# Patient Record
Sex: Female | Born: 1970 | Race: Asian | Hispanic: No | Smoking: Former smoker
Health system: Southern US, Community
[De-identification: ages and names within clinical notes are randomized; demographics above are authoritative.]

## PROBLEM LIST (undated history)

## (undated) ENCOUNTER — Emergency Department (HOSPITAL_COMMUNITY): Admission: EM | Discharge status: Against Medical Advice | Payer: No Typology Code available for payment source

## (undated) DIAGNOSIS — E119 Type 2 diabetes mellitus without complications: Secondary | ICD-10-CM

## (undated) DIAGNOSIS — M1990 Unspecified osteoarthritis, unspecified site: Secondary | ICD-10-CM

## (undated) DIAGNOSIS — O24419 Gestational diabetes mellitus in pregnancy, unspecified control: Secondary | ICD-10-CM

## (undated) DIAGNOSIS — N12 Tubulo-interstitial nephritis, not specified as acute or chronic: Secondary | ICD-10-CM

## (undated) DIAGNOSIS — N200 Calculus of kidney: Secondary | ICD-10-CM

## (undated) DIAGNOSIS — G8929 Other chronic pain: Secondary | ICD-10-CM

## (undated) DIAGNOSIS — O139 Gestational [pregnancy-induced] hypertension without significant proteinuria, unspecified trimester: Secondary | ICD-10-CM

## (undated) DIAGNOSIS — I10 Essential (primary) hypertension: Secondary | ICD-10-CM

## (undated) DIAGNOSIS — E876 Hypokalemia: Secondary | ICD-10-CM

## (undated) DIAGNOSIS — H269 Unspecified cataract: Secondary | ICD-10-CM

## (undated) DIAGNOSIS — E785 Hyperlipidemia, unspecified: Secondary | ICD-10-CM

## (undated) DIAGNOSIS — M549 Dorsalgia, unspecified: Secondary | ICD-10-CM

## (undated) DIAGNOSIS — F419 Anxiety disorder, unspecified: Secondary | ICD-10-CM

## (undated) HISTORY — DX: Calculus of kidney: N20.0

## (undated) HISTORY — DX: Unspecified cataract: H26.9

## (undated) HISTORY — PX: HERNIA REPAIR: SHX51

## (undated) HISTORY — PX: APPENDECTOMY: SHX54

## (undated) HISTORY — DX: Tubulo-interstitial nephritis, not specified as acute or chronic: N12

## (undated) HISTORY — DX: Gestational (pregnancy-induced) hypertension without significant proteinuria, unspecified trimester: O13.9

## (undated) HISTORY — DX: Hyperlipidemia, unspecified: E78.5

## (undated) HISTORY — DX: Anxiety disorder, unspecified: F41.9

## (undated) HISTORY — DX: Hypokalemia: E87.6

## (undated) HISTORY — DX: Gestational diabetes mellitus in pregnancy, unspecified control: O24.419

---

## 1998-01-18 ENCOUNTER — Ambulatory Visit (HOSPITAL_COMMUNITY): Admission: RE | Admit: 1998-01-18 | Discharge: 1998-01-18 | Payer: Self-pay | Admitting: Neurosurgery

## 1998-01-18 ENCOUNTER — Encounter: Payer: Self-pay | Admitting: Neurosurgery

## 1998-09-12 ENCOUNTER — Other Ambulatory Visit: Admission: RE | Admit: 1998-09-12 | Discharge: 1998-09-12 | Payer: Self-pay | Admitting: Obstetrics and Gynecology

## 1999-02-22 ENCOUNTER — Ambulatory Visit (HOSPITAL_COMMUNITY): Admission: RE | Admit: 1999-02-22 | Discharge: 1999-02-22 | Payer: Self-pay | Admitting: Family Medicine

## 1999-02-22 ENCOUNTER — Encounter: Payer: Self-pay | Admitting: Family Medicine

## 1999-05-20 ENCOUNTER — Ambulatory Visit (HOSPITAL_COMMUNITY): Admission: RE | Admit: 1999-05-20 | Discharge: 1999-05-20 | Payer: Self-pay | Admitting: Obstetrics and Gynecology

## 1999-05-20 ENCOUNTER — Encounter: Payer: Self-pay | Admitting: Obstetrics and Gynecology

## 1999-11-03 ENCOUNTER — Inpatient Hospital Stay (HOSPITAL_COMMUNITY): Admission: AD | Admit: 1999-11-03 | Discharge: 1999-11-05 | Payer: Self-pay | Admitting: Obstetrics

## 2001-07-20 ENCOUNTER — Ambulatory Visit (HOSPITAL_COMMUNITY): Admission: RE | Admit: 2001-07-20 | Discharge: 2001-07-20 | Payer: Self-pay | Admitting: Family Medicine

## 2001-07-20 ENCOUNTER — Encounter: Payer: Self-pay | Admitting: Family Medicine

## 2005-10-29 ENCOUNTER — Emergency Department (HOSPITAL_COMMUNITY): Admission: EM | Admit: 2005-10-29 | Discharge: 2005-10-29 | Payer: Self-pay | Admitting: Emergency Medicine

## 2007-07-28 ENCOUNTER — Inpatient Hospital Stay (HOSPITAL_COMMUNITY): Admission: EM | Admit: 2007-07-28 | Discharge: 2007-07-29 | Payer: Self-pay | Admitting: Obstetrics and Gynecology

## 2007-07-28 ENCOUNTER — Encounter (INDEPENDENT_AMBULATORY_CARE_PROVIDER_SITE_OTHER): Payer: Self-pay | Admitting: Obstetrics and Gynecology

## 2007-08-09 ENCOUNTER — Emergency Department (HOSPITAL_COMMUNITY): Admission: EM | Admit: 2007-08-09 | Discharge: 2007-08-09 | Payer: Self-pay | Admitting: Emergency Medicine

## 2007-08-22 ENCOUNTER — Encounter: Admission: RE | Admit: 2007-08-22 | Discharge: 2007-08-22 | Payer: Self-pay | Admitting: Family Medicine

## 2007-09-27 ENCOUNTER — Encounter: Admission: RE | Admit: 2007-09-27 | Discharge: 2007-09-27 | Payer: Self-pay | Admitting: Neurology

## 2008-01-27 ENCOUNTER — Encounter: Admission: RE | Admit: 2008-01-27 | Discharge: 2008-03-17 | Payer: Self-pay | Admitting: Family Medicine

## 2008-06-17 ENCOUNTER — Emergency Department (HOSPITAL_COMMUNITY): Admission: EM | Admit: 2008-06-17 | Discharge: 2008-06-17 | Payer: Self-pay | Admitting: Internal Medicine

## 2009-12-31 ENCOUNTER — Emergency Department (HOSPITAL_COMMUNITY)
Admission: EM | Admit: 2009-12-31 | Discharge: 2009-12-31 | Payer: Self-pay | Source: Home / Self Care | Admitting: Emergency Medicine

## 2010-01-24 ENCOUNTER — Encounter
Admission: RE | Admit: 2010-01-24 | Discharge: 2010-02-12 | Payer: Self-pay | Source: Home / Self Care | Attending: Family Medicine | Admitting: Family Medicine

## 2010-02-02 ENCOUNTER — Encounter: Payer: Self-pay | Admitting: Internal Medicine

## 2010-02-03 ENCOUNTER — Encounter: Payer: Self-pay | Admitting: Family Medicine

## 2010-02-19 ENCOUNTER — Ambulatory Visit: Payer: Medicaid Other | Attending: Family Medicine

## 2010-02-19 ENCOUNTER — Ambulatory Visit: Payer: Medicaid Other

## 2010-02-19 DIAGNOSIS — IMO0001 Reserved for inherently not codable concepts without codable children: Secondary | ICD-10-CM | POA: Insufficient documentation

## 2010-02-19 DIAGNOSIS — M542 Cervicalgia: Secondary | ICD-10-CM | POA: Insufficient documentation

## 2010-02-19 DIAGNOSIS — R209 Unspecified disturbances of skin sensation: Secondary | ICD-10-CM | POA: Insufficient documentation

## 2010-02-19 DIAGNOSIS — R5381 Other malaise: Secondary | ICD-10-CM | POA: Insufficient documentation

## 2010-02-25 ENCOUNTER — Ambulatory Visit: Payer: Medicaid Other | Admitting: Physical Therapy

## 2010-02-27 ENCOUNTER — Ambulatory Visit: Payer: Medicaid Other | Admitting: Physical Therapy

## 2010-03-17 ENCOUNTER — Emergency Department (HOSPITAL_COMMUNITY)
Admission: EM | Admit: 2010-03-17 | Discharge: 2010-03-17 | Disposition: A | Discharge status: Home / Self Care | Payer: Medicaid Other | Attending: Emergency Medicine | Admitting: Emergency Medicine

## 2010-03-17 DIAGNOSIS — IMO0002 Reserved for concepts with insufficient information to code with codable children: Secondary | ICD-10-CM | POA: Insufficient documentation

## 2010-03-17 DIAGNOSIS — F411 Generalized anxiety disorder: Secondary | ICD-10-CM | POA: Insufficient documentation

## 2010-03-17 DIAGNOSIS — R10819 Abdominal tenderness, unspecified site: Secondary | ICD-10-CM | POA: Insufficient documentation

## 2010-03-17 DIAGNOSIS — I10 Essential (primary) hypertension: Secondary | ICD-10-CM | POA: Insufficient documentation

## 2010-03-17 DIAGNOSIS — E876 Hypokalemia: Secondary | ICD-10-CM | POA: Insufficient documentation

## 2010-03-17 LAB — ETHANOL: Alcohol, Ethyl (B): <5 mg/dL (ref 0–10)

## 2010-03-17 LAB — CBC
MCHC: 31.8 g/dL (ref 30.0–36.0)
RDW: 13.6 % (ref 11.5–15.5)

## 2010-03-17 LAB — URINALYSIS, ROUTINE W REFLEX MICROSCOPIC
Hgb urine dipstick: NEGATIVE
Nitrite: NEGATIVE
Specific Gravity, Urine: 1.005 (ref 1.005–1.030)
Urobilinogen, UA: 0.2 mg/dL (ref 0.0–1.0)

## 2010-03-17 LAB — COMPREHENSIVE METABOLIC PANEL
ALT: 17 U/L (ref 0–35)
CO2: 26 mEq/L (ref 19–32)
Calcium: 9.8 mg/dL (ref 8.4–10.5)
Creatinine, Ser: 0.74 mg/dL (ref 0.4–1.2)
GFR calc non Af Amer: >60 mL/min (ref >60)
Glucose, Bld: 112 mg/dL — ABNORMAL HIGH (ref 70–99)

## 2010-03-17 LAB — POCT CARDIAC MARKERS: Myoglobin, poc: 69.4 ng/mL (ref 12–200)

## 2010-03-17 LAB — DIFFERENTIAL
Basophils Absolute: 0 10*3/uL (ref 0.0–0.1)
Basophils Relative: 0 % (ref 0–1)
Eosinophils Relative: 0 % (ref 0–5)
Monocytes Absolute: 0.3 10*3/uL (ref 0.1–1.0)

## 2010-03-17 LAB — WET PREP, GENITAL
Clue Cells Wet Prep HPF POC: NONE SEEN
Trich, Wet Prep: NONE SEEN

## 2010-03-17 LAB — URINE MICROSCOPIC-ADD ON

## 2010-03-17 LAB — RAPID URINE DRUG SCREEN, HOSP PERFORMED
Amphetamines: NOT DETECTED
Barbiturates: NOT DETECTED
Tetrahydrocannabinol: NOT DETECTED

## 2010-03-18 LAB — GC/CHLAMYDIA PROBE AMP, GENITAL: Chlamydia, DNA Probe: NEGATIVE

## 2010-03-25 LAB — POCT CARDIAC MARKERS
CKMB, poc: <1 ng/mL — ABNORMAL LOW (ref 1.0–8.0)
Myoglobin, poc: 49.3 ng/mL (ref 12–200)
Troponin i, poc: <0.05 ng/mL (ref 0.00–0.09)

## 2010-03-25 LAB — POCT I-STAT, CHEM 8
BUN: 8 mg/dL (ref 6–23)
Calcium, Ion: 1.1 mmol/L — ABNORMAL LOW (ref 1.12–1.32)
Chloride: 104 mEq/L (ref 96–112)
Potassium: 3.5 mEq/L (ref 3.5–5.1)

## 2010-04-17 ENCOUNTER — Encounter: Payer: Medicaid Other | Admitting: Obstetrics and Gynecology

## 2010-04-19 ENCOUNTER — Other Ambulatory Visit: Payer: Self-pay | Admitting: Specialist

## 2010-04-19 DIAGNOSIS — R102 Pelvic and perineal pain: Secondary | ICD-10-CM

## 2010-04-22 ENCOUNTER — Encounter: Payer: Medicaid Other | Admitting: Family Medicine

## 2010-04-22 LAB — URINALYSIS, ROUTINE W REFLEX MICROSCOPIC
Ketones, ur: NEGATIVE mg/dL
Nitrite: NEGATIVE
Protein, ur: NEGATIVE mg/dL
Urobilinogen, UA: 0.2 mg/dL (ref 0.0–1.0)
pH: 6.5 (ref 5.0–8.0)

## 2010-04-22 LAB — CBC
HCT: 40.8 % (ref 36.0–46.0)
Hemoglobin: 13.7 g/dL (ref 12.0–15.0)
MCHC: 33.5 g/dL (ref 30.0–36.0)
MCV: 85.1 fL (ref 78.0–100.0)
RBC: 4.8 MIL/uL (ref 3.87–5.11)
RDW: 13.8 % (ref 11.5–15.5)

## 2010-04-22 LAB — DIFFERENTIAL
Basophils Absolute: 0 10*3/uL (ref 0.0–0.1)
Basophils Relative: 0 % (ref 0–1)
Eosinophils Absolute: 0.1 10*3/uL (ref 0.0–0.7)
Eosinophils Relative: 1 % (ref 0–5)
Lymphocytes Relative: 26 % (ref 12–46)
Lymphs Abs: 2.1 10*3/uL (ref 0.7–4.0)
Monocytes Absolute: 0.4 10*3/uL (ref 0.1–1.0)
Monocytes Relative: 5 % (ref 3–12)
Neutro Abs: 5.3 10*3/uL (ref 1.7–7.7)
Neutrophils Relative %: 67 % (ref 43–77)

## 2010-04-22 LAB — COMPREHENSIVE METABOLIC PANEL
BUN: 10 mg/dL (ref 6–23)
CO2: 27 mEq/L (ref 19–32)
Calcium: 9.3 mg/dL (ref 8.4–10.5)
GFR calc non Af Amer: >60 mL/min (ref >60)
Glucose, Bld: 110 mg/dL — ABNORMAL HIGH (ref 70–99)
Total Protein: 6.7 g/dL (ref 6.0–8.3)

## 2010-04-22 LAB — GC/CHLAMYDIA PROBE AMP, GENITAL: GC Probe Amp, Genital: NEGATIVE

## 2010-04-22 LAB — POCT PREGNANCY, URINE: Preg Test, Ur: NEGATIVE

## 2010-04-22 LAB — WET PREP, GENITAL: Yeast Wet Prep HPF POC: NONE SEEN

## 2010-04-22 LAB — LIPASE, BLOOD: Lipase: 19 U/L (ref 11–59)

## 2010-04-23 ENCOUNTER — Ambulatory Visit
Admission: RE | Admit: 2010-04-23 | Discharge: 2010-04-23 | Disposition: A | Discharge status: Home / Self Care | Payer: Medicaid Other | Source: Ambulatory Visit | Attending: Specialist | Admitting: Specialist

## 2010-04-23 DIAGNOSIS — R102 Pelvic and perineal pain: Secondary | ICD-10-CM

## 2010-05-10 ENCOUNTER — Encounter: Payer: Medicaid Other | Admitting: Obstetrics and Gynecology

## 2010-05-28 NOTE — Op Note (Signed)
NAMEASTER, SCREWS                ACCOUNT NO.:  [id number]   MEDICAL RECORD NO.:  [id number]          PATIENT TYPE:  INP   LOCATION:  9307                          FACILITY:  [facility]   PHYSICIAN:  Charles A. Delcambre, MDDATE OF BIRTH:  [birth year]   DATE OF PROCEDURE:  07/28/2007  DATE OF DISCHARGE:                               OPERATIVE REPORT   PREOPERATIVE DIAGNOSES:  1. Bartholin abscess.  2. Cellulitis, left perineum   POSTOPERATIVE DIAGNOSES:  1. Bartholin abscess.  2. Cellulitis, left perineum.   PROCEDURE:  Marsupialization and packing of the Bartholin abscess.   SURGEON:  Charles A. Delcambre, MD   ASSISTANT:  None.   COMPLICATIONS:  None.   ESTIMATED BLOOD LOSS:  Less than 50 mL.   ANESTHESIA:  Spinal.   OPERATIVE FINDINGS:  Very large abscess extending up the entire left  labia as far as the pubic ramus and indurated laterally in that area as  well an area of approximately 3 x 6 cm superiorly up to the pubic rami.   SPECIMEN:  Portion of the cyst or abscess wall to pathology.  Aerobic  and anaerobic cultures were done on the purulent drainage from inside  the incision.  These were all sent to pathology.   COUNTS:  Instrument, sponge, and needle count correct x2.   DESCRIPTION OF PROCEDURE:  The patient was taken to the operating room,  placed in supine position after spinal had been given.  She was then  placed in dorsal lithotomy position in Universal stirrups.  Palpating  carefully our findings were noted as above.  There was no evidence of  pointing area of the abscess.  Therefore, incision was made  approximately 3 cm in the area of the intravaginal Bartholin drainage  area.  A wedge of tissue was excised, so that the opening would be a  round opening and meant to be permanent.  A large amount of purulent  material drained.  Irrigation with 500-600 mL of saline was done to  irrigate out the area after cultures had been taken.  Finger dissection  up to  the pubic ramus was done to break up all loculations.  Bleeding  was minimal.  Running locking 2-0 chromic was used to close the edge in  a circular fashion to remain open at the vaginal incision site.  Marcaine 0.25% with epinephrine had been injected at the incision site  prior to incision with total of 7 mL injected.  Half-inch plain  gauze was then packed up to the upper extent and then backed down to the  incision site and tucked in the vagina.  Hemostasis was excellent.  The  patient tolerated the procedure well.  Straight cath was done of the  bladder.  She was then taken to recovery with physician in attendance  having tolerated the procedure well.      Charles A. Sydnee Cabal, MD  Electronically Signed     CAD/MEDQ  D:  07/28/2007  T:  07/29/2007  Job:  [id number]

## 2010-05-28 NOTE — H&P (Signed)
Renee Mccann, Renee Mccann               ACCOUNT NO.:  [id number]   MEDICAL RECORD NO.:  [id number]          PATIENT TYPE:   LOCATION:                                 FACILITY:   PHYSICIAN:  Charles A. Delcambre, MDDATE OF BIRTH:  11/02/1954   DATE OF ADMISSION:  DATE OF DISCHARGE:                              HISTORY & PHYSICAL   She tends to state that she is 40 years old, but clearly indicates her  birthday is in [birth year], putting her at a 40 year old gravida 2, para 2  complaining of 3 months with a swelling on her labia.  She has seen 2  other OB/GYN offices.  At each office, the cyst was drained in the  office or aspirated and further description and showing her what needle,  she states it was an aspiration most likely.  Records are not available.  She states this cyst has been present for 3 months.  She denies fever,  but she does have increasing pain.  She was seen by Dr. Renaldo Fiddler twice and  then Dr. Windle Guard 5 months ago, all for the same problem.  She sees  Dr. Jeannetta Nap as her PCP.  She was referred from Dr. Cliffton Asters at [facility].   PAST MEDICAL HISTORY:  None.   PAST SURGICAL HISTORY:  None.   MEDICATIONS:  She is given a prescription of Vicodin today; otherwise,  no medications.   ALLERGIES:  None.   SOCIAL HISTORY:  Five cigarettes per day.  She is sexually active with 1  partner who accompanies her today.  No alcohol or drugs.   FAMILY HISTORY:  No major illnesses.   REVIEW OF SYSTEMS:  Full review of system negative.  Pain with intercourse and bulge on her perineum.  No dysuria.  Otherwise  put negative template.Marland Kitchen   PHYSICAL EXAMINATION:  VITAL SIGNS:  Blood pressure 122/80, weight 152  pounds, and height 5 feet 3-1/2 inches.  HEART:  Regular rate and rhythm.  LUNGS:  Clear bilaterally.  ABDOMEN:  Soft, flat, and nontender.  PELVIC:  External female genitalia noted with marked swelling on the  left labia, feels to be likely a 6 x 2 chronic nearly fluctuant but  not  quite abscess inferior aspect of the labia majora introital area.  There  is induration extending superiorly up to the top of the pubic ramus  inferiorly area.  I can detect some adenopathy at this time in the  inguinal area.  The area was tender and erythemic toward midline, but  otherwise just a swelling and induration.  There is no obvious area of  pointing.   ASSESSMENT:  1. Bartholin abscess, 616.2.  2. Vulvar cyst and edema, 624.8.  3. Vulvar cellulitis 616.10.   PLAN:  Incision and drainage and likely packing, followed by IV  antibiotics tomorrow for 24 hours.  We will need to set up home health  to work with packing on Friday.  CBC with diff, CMET will be obtained,  unclear age or period status, so we will go ahead and get a serum  pregnancy test, but I feel  she is most likely 3-4 years postmenopausal.  All questions were answered.  She accepts the risks of bleeding that  would drain  drain infection, and help her with her pain, minimal risk  to injury to the rectum or to the bladder.  She also understands likely  having to pack the area.      Charles A. Sydnee Cabal, MD  Electronically Signed     CAD/MEDQ  D:  07/27/2007  T:  07/28/2007  Job:  [id number]

## 2010-10-11 LAB — COMPREHENSIVE METABOLIC PANEL
BUN: 9
CO2: 28
Calcium: 9
Chloride: 103
Creatinine, Ser: 0.69
GFR calc non Af Amer: >60
Total Bilirubin: 0.6

## 2010-10-11 LAB — CULTURE, ROUTINE-ABSCESS

## 2010-10-11 LAB — ANAEROBIC CULTURE

## 2010-10-11 LAB — CBC
HCT: 41.1
Hemoglobin: 11.6 — ABNORMAL LOW
MCHC: 32.2
MCV: 85.9
Platelets: 201
RBC: 4.16
RBC: 4.78
RDW: 14.4
WBC: 10.8 — ABNORMAL HIGH
WBC: 12.1 — ABNORMAL HIGH

## 2011-07-24 ENCOUNTER — Other Ambulatory Visit: Payer: Self-pay | Admitting: Specialist

## 2011-07-24 DIAGNOSIS — R1011 Right upper quadrant pain: Secondary | ICD-10-CM

## 2011-07-28 ENCOUNTER — Ambulatory Visit
Admission: RE | Admit: 2011-07-28 | Discharge: 2011-07-28 | Disposition: A | Discharge status: Home / Self Care | Payer: Medicaid Other | Source: Ambulatory Visit | Attending: Specialist | Admitting: Specialist

## 2011-07-28 DIAGNOSIS — R1011 Right upper quadrant pain: Secondary | ICD-10-CM

## 2012-06-21 ENCOUNTER — Emergency Department (HOSPITAL_COMMUNITY)
Admission: EM | Admit: 2012-06-21 | Discharge: 2012-06-21 | Disposition: A | Discharge status: Home / Self Care | Payer: Medicaid Other | Attending: Emergency Medicine | Admitting: Emergency Medicine

## 2012-06-21 ENCOUNTER — Emergency Department (HOSPITAL_COMMUNITY): Payer: Medicaid Other

## 2012-06-21 ENCOUNTER — Encounter (HOSPITAL_COMMUNITY): Payer: Self-pay | Admitting: Emergency Medicine

## 2012-06-21 DIAGNOSIS — N12 Tubulo-interstitial nephritis, not specified as acute or chronic: Secondary | ICD-10-CM

## 2012-06-21 DIAGNOSIS — E876 Hypokalemia: Secondary | ICD-10-CM | POA: Insufficient documentation

## 2012-06-21 DIAGNOSIS — Z792 Long term (current) use of antibiotics: Secondary | ICD-10-CM | POA: Insufficient documentation

## 2012-06-21 DIAGNOSIS — I10 Essential (primary) hypertension: Secondary | ICD-10-CM | POA: Insufficient documentation

## 2012-06-21 DIAGNOSIS — N912 Amenorrhea, unspecified: Secondary | ICD-10-CM | POA: Insufficient documentation

## 2012-06-21 DIAGNOSIS — F17200 Nicotine dependence, unspecified, uncomplicated: Secondary | ICD-10-CM | POA: Insufficient documentation

## 2012-06-21 DIAGNOSIS — Z79899 Other long term (current) drug therapy: Secondary | ICD-10-CM | POA: Insufficient documentation

## 2012-06-21 HISTORY — DX: Essential (primary) hypertension: I10

## 2012-06-21 LAB — CBC WITH DIFFERENTIAL/PLATELET
Eosinophils Absolute: 0 10*3/uL (ref 0.0–0.7)
HCT: 44.9 % (ref 36.0–46.0)
Hemoglobin: 14.3 g/dL (ref 12.0–15.0)
Lymphs Abs: 2.4 10*3/uL (ref 0.7–4.0)
MCH: 26.7 pg (ref 26.0–34.0)
MCV: 83.9 fL (ref 78.0–100.0)
Monocytes Absolute: 0.3 10*3/uL (ref 0.1–1.0)
Monocytes Relative: 5 % (ref 3–12)
Neutrophils Relative %: 58 % (ref 43–77)
RBC: 5.35 MIL/uL — ABNORMAL HIGH (ref 3.87–5.11)

## 2012-06-21 LAB — COMPREHENSIVE METABOLIC PANEL
Alkaline Phosphatase: 102 U/L (ref 39–117)
BUN: 9 mg/dL (ref 6–23)
GFR calc Af Amer: 76 mL/min — ABNORMAL LOW (ref >90)
Glucose, Bld: 109 mg/dL — ABNORMAL HIGH (ref 70–99)
Potassium: 3 mEq/L — ABNORMAL LOW (ref 3.5–5.1)
Total Bilirubin: 0.3 mg/dL (ref 0.3–1.2)
Total Protein: 7.5 g/dL (ref 6.0–8.3)

## 2012-06-21 LAB — URINALYSIS, ROUTINE W REFLEX MICROSCOPIC
Bilirubin Urine: NEGATIVE
Ketones, ur: NEGATIVE mg/dL
Nitrite: NEGATIVE
Specific Gravity, Urine: 1.011 (ref 1.005–1.030)
Urobilinogen, UA: 0.2 mg/dL (ref 0.0–1.0)

## 2012-06-21 LAB — LIPASE, BLOOD: Lipase: 21 U/L (ref 11–59)

## 2012-06-21 LAB — URINE MICROSCOPIC-ADD ON

## 2012-06-21 MED ORDER — IOHEXOL 300 MG/ML  SOLN
100.0000 mL | Freq: Once | INTRAMUSCULAR | Status: AC | PRN
  Administered 2012-06-21: 100 mL via INTRAVENOUS

## 2012-06-21 MED ORDER — LEVOFLOXACIN 500 MG PO TABS: 500.0000 mg | ORAL_TABLET | Freq: Every day | ORAL | Status: DC

## 2012-06-21 MED ORDER — POTASSIUM CHLORIDE CRYS ER 20 MEQ PO TBCR
40.0000 meq | EXTENDED_RELEASE_TABLET | Freq: Once | ORAL | Status: AC
  Administered 2012-06-21: 40 meq via ORAL
  Filled 2012-06-21: qty 2

## 2012-06-21 MED ORDER — IOHEXOL 300 MG/ML  SOLN
50.0000 mL | Freq: Once | INTRAMUSCULAR | Status: AC | PRN
  Administered 2012-06-21: 50 mL via ORAL

## 2012-06-21 MED ORDER — HYDROMORPHONE HCL PF 1 MG/ML IJ SOLN
1.0000 mg | Freq: Once | INTRAMUSCULAR | Status: AC
  Administered 2012-06-21: 1 mg via INTRAVENOUS
  Filled 2012-06-21: qty 1

## 2012-06-21 MED ORDER — DEXTROSE 5 % IV SOLN
1.0000 g | Freq: Once | INTRAVENOUS | Status: AC
  Administered 2012-06-21: 1 g via INTRAVENOUS
  Filled 2012-06-21: qty 10

## 2012-06-21 MED ORDER — DIPHENHYDRAMINE HCL 50 MG/ML IJ SOLN
50.0000 mg | Freq: Once | INTRAMUSCULAR | Status: AC
  Administered 2012-06-21: 50 mg via INTRAVENOUS
  Filled 2012-06-21: qty 1

## 2012-06-21 NOTE — ED Notes (Addendum)
Pt presenting to ed with c/o abdominal pain with pain that radiates into her back x 2-3 days. Pt denies nausea, vomiting and diarrhea. Pt states she was seen for the same last week and received medications but they are not helping. Pt is also vietnamese speaking . Pt also with c/o itching all over

## 2012-06-21 NOTE — ED Provider Notes (Addendum)
History     CSN: [id number]  Arrival date & time 06/21/12  4098   First MD Initiated Contact with Patient 06/21/12 [phone]    Patient speaks no English history is obtained using medical interpreter through [facility] interpreter language line  Chief Complaint  Patient presents with  . Abdominal Pain  . Back Pain    (Consider location/radiation/quality/duration/timing/severity/associated sxs/prior treatment) HPI Complains of right flank pain radiating to right groin onset approximately one week ago. Seen by her primary care physician 3 days ago prescribed tramadol, Bactrim DS, hydrocodone with ibuprofen, transient relief. Pain is now severe. Worse with changing position improved with remaining still no vomiting no anorexia no fever last bowel movement yesterday, normal no urinary symptoms. Pain is severe at present. No cough Past Medical History  Diagnosis Date  . Hypertension     History reviewed. No pertinent past surgical history.  No family history on file.  History  Substance Use Topics  . Smoking status: Current Every Day Smoker    Types: Cigarettes  . Smokeless tobacco: Not on file  . Alcohol Use: No   no drug use  OB History   Grav Para Term Preterm Abortions TAB SAB Ect Mult Living                  Review of Systems  Constitutional: Negative.   HENT: Negative.   Respiratory: Negative.   Cardiovascular: Negative.   Gastrointestinal: Positive for abdominal pain.  Genitourinary: Positive for flank pain.       Amenorrheic for 2 years  Musculoskeletal: Negative.   Skin: Negative.   Neurological: Negative.   Psychiatric/Behavioral: Negative.   All other systems reviewed and are negative.    Allergies  Review of patient's allergies indicates no known allergies.  Home Medications   Current Outpatient Rx  Name  Route  Sig  Dispense  Refill  . HYDROcodone-ibuprofen (VICOPROFEN) 7.5-200 MG per tablet   Oral   Take 1 tablet by mouth at bedtime as needed for  pain.         Marland Kitchen lisinopril-hydrochlorothiazide (PRINZIDE,ZESTORETIC) 20-25 MG per tablet   Oral   Take 1 tablet by mouth daily.         Marland Kitchen sulfamethoxazole-trimethoprim (BACTRIM DS) 800-160 MG per tablet   Oral   Take 1 tablet by mouth 2 (two) times daily.         . traMADol (ULTRAM) 50 MG tablet   Oral   Take 50 mg by mouth every 8 (eight) hours as needed for pain.           BP 161/82  Pulse 70  Temp(Src) 98.6 F (37 C) (Oral)  Resp 18  SpO2 98%  Physical Exam  Nursing note and vitals reviewed. Constitutional: She appears well-developed and well-nourished.  HENT:  Head: Normocephalic and atraumatic.  Eyes: Conjunctivae are normal. Pupils are equal, round, and reactive to light.  Neck: Neck supple. No tracheal deviation present. No thyromegaly present.  Cardiovascular: Normal rate and regular rhythm.   No murmur heard. Pulmonary/Chest: Effort normal and breath sounds normal.  Abdominal: Soft. Bowel sounds are normal. She exhibits no distension. There is no tenderness.  Genitourinary:  Right flank tenderness  Musculoskeletal: Normal range of motion. She exhibits no edema and no tenderness.  Neurological: She is alert. Coordination normal.  Skin: Skin is warm and dry. No rash noted.  Psychiatric: She has a normal mood and affect.    ED Course  Procedures (including critical care time)  Labs  Reviewed  CBC WITH DIFFERENTIAL  COMPREHENSIVE METABOLIC PANEL  LIPASE, BLOOD  URINALYSIS, ROUTINE W REFLEX MICROSCOPIC   No results found.   No diagnosis found. Results for orders placed during the hospital encounter of 06/21/12  CBC WITH DIFFERENTIAL      Result Value Range   WBC 6.5  4.0 - 10.5 K/uL   RBC 5.35 (*) 3.87 - 5.11 MIL/uL   Hemoglobin 14.3  12.0 - 15.0 g/dL   HCT 13.0  86.5 - 78.4 %   MCV 83.9  78.0 - 100.0 fL   MCH 26.7  26.0 - 34.0 pg   MCHC 31.8  30.0 - 36.0 g/dL   RDW 69.6  29.5 - 28.4 %   Platelets 214  150 - 400 K/uL   Neutrophils Relative  % 58  43 - 77 %   Neutro Abs 3.8  1.7 - 7.7 K/uL   Lymphocytes Relative 37  12 - 46 %   Lymphs Abs 2.4  0.7 - 4.0 K/uL   Monocytes Relative 5  3 - 12 %   Monocytes Absolute 0.3  0.1 - 1.0 K/uL   Eosinophils Relative 1  0 - 5 %   Eosinophils Absolute 0.0  0.0 - 0.7 K/uL   Basophils Relative 0  0 - 1 %   Basophils Absolute 0.0  0.0 - 0.1 K/uL  COMPREHENSIVE METABOLIC PANEL      Result Value Range   Sodium 139  135 - 145 mEq/L   Potassium 3.0 (*) 3.5 - 5.1 mEq/L   Chloride 103  96 - 112 mEq/L   CO2 25  19 - 32 mEq/L   Glucose, Bld 109 (*) 70 - 99 mg/dL   BUN 9  6 - 23 mg/dL   Creatinine, Ser 1.32  0.50 - 1.10 mg/dL   Calcium 9.3  8.4 - 44.0 mg/dL   Total Protein 7.5  6.0 - 8.3 g/dL   Albumin 3.6  3.5 - 5.2 g/dL   AST 18  0 - 37 U/L   ALT 15  0 - 35 U/L   Alkaline Phosphatase 102  39 - 117 U/L   Total Bilirubin 0.3  0.3 - 1.2 mg/dL   GFR calc non Af Amer 66 (*) >90 mL/min   GFR calc Af Amer 76 (*) >90 mL/min  LIPASE, BLOOD      Result Value Range   Lipase 21  11 - 59 U/L  URINALYSIS, ROUTINE W REFLEX MICROSCOPIC      Result Value Range   Color, Urine YELLOW  YELLOW   APPearance CLEAR  CLEAR   Specific Gravity, Urine 1.011  1.005 - 1.030   pH 6.5  5.0 - 8.0   Glucose, UA NEGATIVE  NEGATIVE mg/dL   Hgb urine dipstick NEGATIVE  NEGATIVE   Bilirubin Urine NEGATIVE  NEGATIVE   Ketones, ur NEGATIVE  NEGATIVE mg/dL   Protein, ur NEGATIVE  NEGATIVE mg/dL   Urobilinogen, UA 0.2  0.0 - 1.0 mg/dL   Nitrite NEGATIVE  NEGATIVE   Leukocytes, UA MODERATE (*) NEGATIVE  URINE MICROSCOPIC-ADD ON      Result Value Range   Squamous Epithelial / LPF FEW (*) RARE   WBC, UA 11-20  <3 WBC/hpf   Bacteria, UA RARE  RARE   Ct Abdomen Pelvis W Contrast  06/21/2012   *RADIOLOGY REPORT*  Clinical Data: Abdominal pain radiating into the back for 2-3 days.  CT ABDOMEN AND PELVIS WITH CONTRAST  Technique:  Multidetector CT imaging of the  abdomen and pelvis was performed following the standard protocol  during bolus administration of intravenous contrast.  Contrast: OMNIPAQUE IOHEXOL 300 MG/ML  SOLN  Comparison: CT abdomen and pelvis 06/17/2008.  Findings: There is some dependent atelectasis in lung bases.  No pleural or pericardial effusion.  Heart size is upper normal.  The liver, gallbladder, spleen, adrenal glands, pancreas and kidneys appear normal.  Small calcified uterine fibroid is noted. Adnexa are unremarkable.  Urinary bladder appears normal.  The stomach, small and large bowel and appendix appear normal. Injection granulomata in the buttocks are unchanged.  No focal bony abnormality is identified.  IMPRESSION:  1.  No acute finding or finding to explain the patient's symptoms. 2.  No change in a small calcified uterine fibroid.   Original Report Authenticated By: Holley Dexter, M.D.    1320 p.m. Patient developed localized itching at IV site after ceftriaxone started. Infusion rate was slowed and patient was administered Benadryl 50 mg IV, my order At 3:15 PM she is resting comfortably pain is improved no further rash.  MDM  In light of patient perhaps having mild reaction to ceftriaxone Will start Levaquin. Discontinue Bactrim. Plan prescription Levaquin. Patient can take hydrocodone or tramadol as needed for pain. Urine for culture Followup Dr. Mayford Knife one week. Blood pressure recheck one week Diagnosis #1 pyelonephritis #2 hypertension #3 hypokalemia        Doug Sou, MD 06/21/12 1528  Doug Sou, MD 06/21/12 4098

## 2012-06-21 NOTE — ED Notes (Signed)
Patient transported to CT 

## 2012-06-29 ENCOUNTER — Emergency Department (HOSPITAL_COMMUNITY): Payer: Medicaid Other

## 2012-06-29 ENCOUNTER — Encounter (HOSPITAL_COMMUNITY): Payer: Self-pay

## 2012-06-29 ENCOUNTER — Emergency Department (HOSPITAL_COMMUNITY)
Admission: EM | Admit: 2012-06-29 | Discharge: 2012-06-29 | Disposition: A | Discharge status: Home / Self Care | Payer: Medicaid Other | Attending: Emergency Medicine | Admitting: Emergency Medicine

## 2012-06-29 DIAGNOSIS — R109 Unspecified abdominal pain: Secondary | ICD-10-CM

## 2012-06-29 DIAGNOSIS — F17200 Nicotine dependence, unspecified, uncomplicated: Secondary | ICD-10-CM | POA: Insufficient documentation

## 2012-06-29 DIAGNOSIS — I10 Essential (primary) hypertension: Secondary | ICD-10-CM | POA: Insufficient documentation

## 2012-06-29 DIAGNOSIS — Z79899 Other long term (current) drug therapy: Secondary | ICD-10-CM | POA: Insufficient documentation

## 2012-06-29 DIAGNOSIS — Z87448 Personal history of other diseases of urinary system: Secondary | ICD-10-CM | POA: Insufficient documentation

## 2012-06-29 DIAGNOSIS — R112 Nausea with vomiting, unspecified: Secondary | ICD-10-CM | POA: Insufficient documentation

## 2012-06-29 LAB — CBC WITH DIFFERENTIAL/PLATELET
Basophils Relative: 1 % (ref 0–1)
Eosinophils Absolute: 0.1 10*3/uL (ref 0.0–0.7)
Hemoglobin: 15.2 g/dL — ABNORMAL HIGH (ref 12.0–15.0)
MCH: 27 pg (ref 26.0–34.0)
MCHC: 32.8 g/dL (ref 30.0–36.0)
Monocytes Relative: 6 % (ref 3–12)
Neutrophils Relative %: 60 % (ref 43–77)
Platelets: 234 10*3/uL (ref 150–400)

## 2012-06-29 LAB — BASIC METABOLIC PANEL
BUN: 12 mg/dL (ref 6–23)
Creatinine, Ser: 0.76 mg/dL (ref 0.50–1.10)
GFR calc Af Amer: >90 mL/min (ref >90)
GFR calc non Af Amer: >90 mL/min (ref >90)
Potassium: 4 mEq/L (ref 3.5–5.1)

## 2012-06-29 LAB — URINALYSIS, ROUTINE W REFLEX MICROSCOPIC
Bilirubin Urine: NEGATIVE
Ketones, ur: NEGATIVE mg/dL
Nitrite: NEGATIVE
Protein, ur: NEGATIVE mg/dL
Urobilinogen, UA: 0.2 mg/dL (ref 0.0–1.0)

## 2012-06-29 LAB — HEPATIC FUNCTION PANEL
ALT: 24 U/L (ref 0–35)
AST: 26 U/L (ref 0–37)
Alkaline Phosphatase: 101 U/L (ref 39–117)
Bilirubin, Direct: <0.1 mg/dL (ref 0.0–0.3)
Total Bilirubin: 0.4 mg/dL (ref 0.3–1.2)

## 2012-06-29 LAB — URINE MICROSCOPIC-ADD ON

## 2012-06-29 MED ORDER — OXYCODONE-ACETAMINOPHEN 5-325 MG PO TABS: 1.0000 | ORAL_TABLET | Freq: Four times a day (QID) | ORAL | Status: DC | PRN

## 2012-06-29 MED ORDER — PROMETHAZINE HCL 25 MG PO TABS: 25.0000 mg | ORAL_TABLET | Freq: Four times a day (QID) | ORAL | Status: DC | PRN

## 2012-06-29 MED ORDER — METHOCARBAMOL 500 MG PO TABS
500.0000 mg | ORAL_TABLET | Freq: Once | ORAL | Status: AC
  Administered 2012-06-29: 500 mg via ORAL
  Filled 2012-06-29: qty 1

## 2012-06-29 MED ORDER — METHOCARBAMOL 500 MG PO TABS: 500.0000 mg | ORAL_TABLET | Freq: Two times a day (BID) | ORAL | Status: DC

## 2012-06-29 MED ORDER — HYDROMORPHONE HCL PF 1 MG/ML IJ SOLN
1.0000 mg | Freq: Once | INTRAMUSCULAR | Status: AC
  Administered 2012-06-29: 1 mg via INTRAVENOUS
  Filled 2012-06-29: qty 1

## 2012-06-29 MED ORDER — ONDANSETRON HCL 4 MG/2ML IJ SOLN
4.0000 mg | Freq: Once | INTRAMUSCULAR | Status: AC
  Administered 2012-06-29: 4 mg via INTRAVENOUS
  Filled 2012-06-29: qty 2

## 2012-06-29 NOTE — Progress Notes (Signed)
Pt confirms pcp is dwight williams EPIC updated  

## 2012-06-29 NOTE — ED Provider Notes (Signed)
Medical screening examination/treatment/procedure(s) were performed by non-physician practitioner and as supervising physician I was immediately available for consultation/collaboration.  Braeden Kennan, MD 06/29/12 1650 

## 2012-06-29 NOTE — ED Provider Notes (Signed)
History     CSN: [id number]  Arrival date & time 06/29/12  4098   First MD Initiated Contact with Patient 06/29/12 [phone]      Chief Complaint  Patient presents with  . Emesis  . Flank Pain    (Consider location/radiation/quality/duration/timing/severity/associated sxs/prior treatment) HPI Comments: Patient presenting with right flank pain that has been present for the past 3 weeks.  Pain worse over the past two days.  She reports that the pain has been constant.  She is unable to describe the pain.  She reports that the pain is worse with movement.  She denies any acute injury or trauma.  She was seen by her PCP for this pain and was given Tramadol, which she reports gives her minimal relief.  She was also seen in the ED for this same pain on 06/21/12 and was diagnosed with Pyelonephritis.  She was started on Levaquin, which she has completed.  She had a CT ab/pelvis done at that time, which was negative.  She was also prescribed Vicodin at that time, which she feels helps somewhat.  She reports that earlier today she felt nauseous and had several episodes of vomiting.  No blood in her emesis.  She denies She denies fever or chills.  Denies dysuria, increased urinary frequency, urgency, and hematuria.  She denies abdominal pain.    The history is provided by the patient. The history is limited by a language barrier. A language interpreter was used.    Past Medical History  Diagnosis Date  . Hypertension     History reviewed. No pertinent past surgical history.  No family history on file.  History  Substance Use Topics  . Smoking status: Current Every Day Smoker    Types: Cigarettes  . Smokeless tobacco: Not on file  . Alcohol Use: No    OB History   Grav Para Term Preterm Abortions TAB SAB Ect Mult Living                  Review of Systems  All other systems reviewed and are negative.    Allergies  Review of patient's allergies indicates no known allergies.  Home  Medications   Current Outpatient Rx  Name  Route  Sig  Dispense  Refill  . lisinopril-hydrochlorothiazide (PRINZIDE,ZESTORETIC) 20-25 MG per tablet   Oral   Take 1 tablet by mouth daily.         Marland Kitchen levofloxacin (LEVAQUIN) 500 MG tablet   Oral   Take 500 mg by mouth daily. Started on 06-21-12. Pt completed therapy on 06-28-12           BP 117/84  Pulse 75  Temp(Src) 98.8 F (37.1 C) (Oral)  Resp 18  SpO2 98%  Physical Exam  Nursing note reviewed. Constitutional: She appears well-developed and well-nourished.  HENT:  Head: Normocephalic and atraumatic.  Mouth/Throat: Oropharynx is clear and moist.  Cardiovascular: Normal rate, regular rhythm and normal heart sounds.   Pulmonary/Chest: Effort normal and breath sounds normal.  Abdominal: Normal appearance and bowel sounds are normal. She exhibits no distension and no mass. There is no tenderness. There is CVA tenderness. There is no rigidity, no rebound and no guarding.  Right CVA tenderness  Musculoskeletal: Normal range of motion.  Neurological: She is alert.  Skin: Skin is warm and dry.  Psychiatric: She has a normal mood and affect.    ED Course  Procedures (including critical care time)  Labs Reviewed  URINALYSIS, ROUTINE W REFLEX MICROSCOPIC -  Abnormal; Notable for the following:    APPearance CLOUDY (*)    Leukocytes, UA SMALL (*)    All other components within normal limits  URINE MICROSCOPIC-ADD ON - Abnormal; Notable for the following:    Squamous Epithelial / LPF MANY (*)    Casts HYALINE CASTS (*)    Crystals CA OXALATE CRYSTALS (*)    All other components within normal limits  CBC WITH DIFFERENTIAL  BASIC METABOLIC PANEL   US Abdomen Complete  06/29/2012   *RADIOLOGY REPORT*  Clinical Data:  Flank pain.  Abdominal pain.  Vomiting.  COMPLETE ABDOMINAL ULTRASOUND  Comparison:  06/21/2012 CT.  Findings:  Gallbladder:  No gallstones, gallbladder wall thickening, or pericholecystic fluid.  Common bile duct:   2 mm.  Liver:  No focal lesion identified.  Within normal limits in parenchymal echogenicity.  IVC:  Appears normal.  Pancreas:  No focal abnormality seen.  Spleen:  5.8 cm.  No focal mass.  Right Kidney:  10.5 cm. No hydronephrosis or renal mass.  Left Kidney:  10.3 cm. No hydronephrosis or renal mass.  Abdominal aorta:  No aneurysm identified.  IMPRESSION: Negative abdominal ultrasound.   Original Report Authenticated By: Lacy Duverney, M.D.     No diagnosis found.    MDM  Patient presenting with right flank pain that has been present for the past 3 weeks.  Patient has also had a negative CT of her Abdomen/Pelvis and also a negative Abdominal Ultrasound.  UA did not show infection today.  Patient afebrile.  Pain improved after given medications in the ED.  Patient able to tolerate PO liquids.  Patient stable for discharge.  Return precautions given.          Pascal Lux [place], PA-C 06/29/12 [phone]

## 2012-06-29 NOTE — ED Notes (Signed)
Pt c/o rt flank pain/vomiting/dizziness/difficulty urinating, pt seen here 06/21/12 dx with pyelonephritis and completed antibotics with no relief

## 2012-07-31 ENCOUNTER — Emergency Department (HOSPITAL_COMMUNITY): Payer: Medicaid Other

## 2012-07-31 ENCOUNTER — Emergency Department (HOSPITAL_COMMUNITY)
Admission: EM | Admit: 2012-07-31 | Discharge: 2012-07-31 | Disposition: A | Discharge status: Home / Self Care | Payer: Medicaid Other | Attending: Emergency Medicine | Admitting: Emergency Medicine

## 2012-07-31 ENCOUNTER — Encounter (HOSPITAL_COMMUNITY): Payer: Self-pay

## 2012-07-31 DIAGNOSIS — Z79899 Other long term (current) drug therapy: Secondary | ICD-10-CM | POA: Insufficient documentation

## 2012-07-31 DIAGNOSIS — F17200 Nicotine dependence, unspecified, uncomplicated: Secondary | ICD-10-CM | POA: Insufficient documentation

## 2012-07-31 DIAGNOSIS — R55 Syncope and collapse: Secondary | ICD-10-CM | POA: Insufficient documentation

## 2012-07-31 DIAGNOSIS — R51 Headache: Secondary | ICD-10-CM | POA: Insufficient documentation

## 2012-07-31 DIAGNOSIS — I10 Essential (primary) hypertension: Secondary | ICD-10-CM | POA: Insufficient documentation

## 2012-07-31 LAB — CBC WITH DIFFERENTIAL/PLATELET
Basophils Absolute: 0 10*3/uL (ref 0.0–0.1)
Eosinophils Absolute: 0 10*3/uL (ref 0.0–0.7)
Eosinophils Relative: 1 % (ref 0–5)
MCH: 27 pg (ref 26.0–34.0)
MCHC: 32.3 g/dL (ref 30.0–36.0)
MCV: 83.8 fL (ref 78.0–100.0)
Platelets: 196 10*3/uL (ref 150–400)
RDW: 13.9 % (ref 11.5–15.5)

## 2012-07-31 LAB — COMPREHENSIVE METABOLIC PANEL
ALT: 22 U/L (ref 0–35)
AST: 24 U/L (ref 0–37)
Calcium: 9.2 mg/dL (ref 8.4–10.5)
Creatinine, Ser: 0.72 mg/dL (ref 0.50–1.10)
GFR calc Af Amer: >90 mL/min (ref >90)
Sodium: 140 mEq/L (ref 135–145)
Total Protein: 7.2 g/dL (ref 6.0–8.3)

## 2012-07-31 MED ORDER — HYDROMORPHONE HCL PF 1 MG/ML IJ SOLN
1.0000 mg | Freq: Once | INTRAMUSCULAR | Status: DC
  Filled 2012-07-31: qty 1

## 2012-07-31 MED ORDER — ONDANSETRON HCL 4 MG/2ML IJ SOLN
4.0000 mg | Freq: Once | INTRAMUSCULAR | Status: DC
  Filled 2012-07-31: qty 2

## 2012-07-31 NOTE — ED Notes (Signed)
She (with interpretation assistance from her son) tells [place] that she "passed out" while "having an argument" with her daughter.  She states it was a brief l.o.c., and that she currently has a mild, generalized h/a.  She moves all extremities with ease on command and is in no distress.

## 2012-07-31 NOTE — ED Provider Notes (Signed)
History    CSN: [id number] Arrival date & time 07/31/12  1107  First MD Initiated Contact with Patient 07/31/12 1149     Chief Complaint  Patient presents with  . Loss of Consciousness    Anxiety-this occurred during a family argument.   (Consider location/radiation/quality/duration/timing/severity/associated sxs/prior Treatment) Patient is a 42 y.o. female presenting with syncope. The history is provided by a relative (the pt was upset and passed out hitting her head.  pt has some pain now).  Loss of Consciousness Episode history:  Single Most recent episode:  Today Timing:  Rare Progression:  Improving Chronicity:  New Context: not blood draw   Associated symptoms: headaches   Associated symptoms: no chest pain and no seizures    Past Medical History  Diagnosis Date  . Hypertension    History reviewed. No pertinent past surgical history. History reviewed. No pertinent family history. History  Substance Use Topics  . Smoking status: Current Every Day Smoker    Types: Cigarettes  . Smokeless tobacco: Not on file  . Alcohol Use: No   OB History   Grav Para Term Preterm Abortions TAB SAB Ect Mult Living                 Review of Systems  Constitutional: Negative for appetite change and fatigue.  HENT: Negative for congestion, sinus pressure and ear discharge.   Eyes: Negative for discharge.  Respiratory: Negative for cough.   Cardiovascular: Positive for syncope. Negative for chest pain.  Gastrointestinal: Negative for abdominal pain and diarrhea.  Genitourinary: Negative for frequency and hematuria.  Musculoskeletal: Negative for back pain.  Skin: Negative for rash.  Neurological: Positive for headaches. Negative for seizures.  Psychiatric/Behavioral: Negative for hallucinations.    Allergies  Other  Home Medications   Current Outpatient Rx  Name  Route  Sig  Dispense  Refill  . ketoprofen (ORUDIS) 75 MG capsule   Oral   Take 75 mg by mouth 3 (three)  times daily as needed for pain.         Marland Kitchen lisinopril-hydrochlorothiazide (PRINZIDE,ZESTORETIC) 20-25 MG per tablet   Oral   Take 1 tablet by mouth daily.         Marland Kitchen omeprazole (PRILOSEC) 20 MG capsule   Oral   Take 20 mg by mouth daily.          BP 164/103  Pulse 65  Temp(Src) 98.5 F (36.9 C) (Oral)  Resp 16  SpO2 98% Physical Exam  Constitutional: She is oriented to person, place, and time. She appears well-developed.  HENT:  Head: Normocephalic.  Eyes: Conjunctivae and EOM are normal. No scleral icterus.  Neck: Neck supple. No thyromegaly present.  Cardiovascular: Normal rate and regular rhythm.  Exam reveals no gallop and no friction rub.   No murmur heard. Pulmonary/Chest: No stridor. She has no wheezes. She has no rales. She exhibits no tenderness.  Abdominal: She exhibits no distension. There is no tenderness. There is no rebound.  Musculoskeletal: Normal range of motion. She exhibits no edema.  Lymphadenopathy:    She has no cervical adenopathy.  Neurological: She is oriented to person, place, and time. Coordination normal.  Skin: No rash noted. No erythema.  Psychiatric: She has a normal mood and affect. Her behavior is normal.    ED Course  Procedures (including critical care time) Labs Reviewed  CBC WITH DIFFERENTIAL - Abnormal; Notable for the following:    RBC 5.51 (*)    HCT 46.2 (*)  All other components within normal limits  COMPREHENSIVE METABOLIC PANEL - Abnormal; Notable for the following:    Glucose, Bld 105 (*)    All other components within normal limits   Ct Head Wo Contrast  07/31/2012   *RADIOLOGY REPORT*  Clinical Data: Loss of consciousness  CT HEAD WITHOUT CONTRAST  Technique:  Contiguous axial images were obtained from the base of the skull through the vertex without contrast.  Comparison: 12/31/2009  Findings: Normal sulcation.  No evidence of hemorrhage, infarct, mass, or hydrocephalus.  Calvarium is intact.  IMPRESSION: Normal head  CT   Original Report Authenticated By: Esperanza Heir, M.D.   1. Syncope     MDM    Benny Lennert, MD 07/31/12 [phone]

## 2012-11-08 ENCOUNTER — Ambulatory Visit: Payer: Medicaid Other | Attending: Internal Medicine

## 2013-02-01 ENCOUNTER — Encounter: Payer: Self-pay | Admitting: Internal Medicine

## 2013-02-04 ENCOUNTER — Encounter: Payer: Self-pay | Admitting: Internal Medicine

## 2013-02-04 ENCOUNTER — Ambulatory Visit (INDEPENDENT_AMBULATORY_CARE_PROVIDER_SITE_OTHER): Payer: Medicaid Other | Admitting: Internal Medicine

## 2013-02-04 VITALS — Ht 63.5 in | Wt 157.0 lb

## 2013-02-04 DIAGNOSIS — R109 Unspecified abdominal pain: Secondary | ICD-10-CM

## 2013-02-05 NOTE — Progress Notes (Signed)
NOT SEEN> Does not speak english. No translator. Rescheduled with another provider for another day

## 2013-02-08 ENCOUNTER — Encounter: Payer: Self-pay | Admitting: Physician Assistant

## 2013-02-08 ENCOUNTER — Ambulatory Visit (INDEPENDENT_AMBULATORY_CARE_PROVIDER_SITE_OTHER): Payer: Medicaid Other | Admitting: Physician Assistant

## 2013-02-08 ENCOUNTER — Other Ambulatory Visit (INDEPENDENT_AMBULATORY_CARE_PROVIDER_SITE_OTHER): Payer: Medicaid Other

## 2013-02-08 VITALS — BP 90/60 | HR 80 | Ht 63.5 in | Wt 155.6 lb

## 2013-02-08 DIAGNOSIS — R1011 Right upper quadrant pain: Secondary | ICD-10-CM

## 2013-02-08 DIAGNOSIS — R634 Abnormal weight loss: Secondary | ICD-10-CM

## 2013-02-08 DIAGNOSIS — R1013 Epigastric pain: Secondary | ICD-10-CM

## 2013-02-08 DIAGNOSIS — Z9889 Other specified postprocedural states: Secondary | ICD-10-CM

## 2013-02-08 DIAGNOSIS — G8929 Other chronic pain: Secondary | ICD-10-CM

## 2013-02-08 DIAGNOSIS — Z9049 Acquired absence of other specified parts of digestive tract: Secondary | ICD-10-CM | POA: Insufficient documentation

## 2013-02-08 DIAGNOSIS — I10 Essential (primary) hypertension: Secondary | ICD-10-CM | POA: Insufficient documentation

## 2013-02-08 LAB — CBC WITH DIFFERENTIAL/PLATELET
BASOS PCT: 0.4 % (ref 0.0–3.0)
Basophils Absolute: 0 10*3/uL (ref 0.0–0.1)
Eosinophils Absolute: 0.1 10*3/uL (ref 0.0–0.7)
Eosinophils Relative: 0.9 % (ref 0.0–5.0)
HCT: 42.1 % (ref 36.0–46.0)
HEMOGLOBIN: 13.8 g/dL (ref 12.0–15.0)
LYMPHS ABS: 2.9 10*3/uL (ref 0.7–4.0)
LYMPHS PCT: 36.7 % (ref 12.0–46.0)
MCHC: 32.8 g/dL (ref 30.0–36.0)
MCV: 84.6 fl (ref 78.0–100.0)
Monocytes Absolute: 0.4 10*3/uL (ref 0.1–1.0)
Monocytes Relative: 4.4 % (ref 3.0–12.0)
NEUTROS ABS: 4.6 10*3/uL (ref 1.4–7.7)
NEUTROS PCT: 57.6 % (ref 43.0–77.0)
Platelets: 250 10*3/uL (ref 150.0–400.0)
RBC: 4.98 Mil/uL (ref 3.87–5.11)
RDW: 14.2 % (ref 11.5–14.6)
WBC: 7.9 10*3/uL (ref 4.5–10.5)

## 2013-02-08 LAB — COMPREHENSIVE METABOLIC PANEL
ALBUMIN: 4 g/dL (ref 3.5–5.2)
ALT: 21 U/L (ref 0–35)
AST: 19 U/L (ref 0–37)
Alkaline Phosphatase: 77 U/L (ref 39–117)
BILIRUBIN TOTAL: 0.6 mg/dL (ref 0.3–1.2)
BUN: 17 mg/dL (ref 6–23)
CO2: 31 meq/L (ref 19–32)
Calcium: 9.5 mg/dL (ref 8.4–10.5)
Chloride: 101 mEq/L (ref 96–112)
Creatinine, Ser: 0.9 mg/dL (ref 0.4–1.2)
GFR: 72.88 mL/min (ref >60.00)
GLUCOSE: 200 mg/dL — AB (ref 70–99)
POTASSIUM: 3.3 meq/L — AB (ref 3.5–5.1)
SODIUM: 138 meq/L (ref 135–145)
TOTAL PROTEIN: 7.3 g/dL (ref 6.0–8.3)

## 2013-02-08 MED ORDER — PANTOPRAZOLE SODIUM 40 MG IV SOLR: 40.0000 mg | Freq: Two times a day (BID) | INTRAVENOUS | Status: DC

## 2013-02-08 NOTE — Progress Notes (Signed)
Subjective:    Patient ID: Renee RobsonQuyen L Mccann, female    DOB: Dec 14, 1970, 43 y.o.   MRN: [id number]  HPI Renee Mccann is a [age] year old [place] female who presents with complaints of abdominal pain. She does not speak English but brings her son with her today to interpret. She does have history of hypertension and is status post appendectomy. She has not had any GI evaluation previously. She states that she has been having upper abdominal pain for at least 6 months but her symptoms have been worse over the past month and associated with a 10 pound weight loss. She says her appetite is okay but she hurts when she eats and has been eating less. Her pain is described as "achy ". She also complains of abdominal bloating. She has not had any nausea or vomiting. No fever or chills. No diarrhea melena or hematochezia. She had apparently been taking ketoprofen at one  point last year but says she has not had any of this medicine over the past couple of months and denies any aspirin or NSAID use. She has been on omeprazole 20 mg by mouth daily over the past few months. She had undergone upper abdominal ultrasound in June of 2014 which was a negative exam with a normal gallbladder and common bile duct of 2 mm. CT scan of the abdomen and pelvis with contrast was also negative in June of 2014 with the exception of a small calcified uterine fibroid. Labs done in July 2014 were unremarkable.    Review of Systems  Constitutional: Positive for unexpected weight change.  HENT: Negative.   Eyes: Negative.   Respiratory: Negative.   Cardiovascular: Negative.   Gastrointestinal: Positive for abdominal pain.  Endocrine: Negative.   Genitourinary: Negative.   Musculoskeletal: Negative.   Skin: Negative.   Allergic/Immunologic: Negative.   Neurological: Negative.   Hematological: Negative.   Psychiatric/Behavioral: Negative.    Outpatient Prescriptions Prior to Visit  Medication Sig Dispense Refill  . ketoprofen  (ORUDIS) 75 MG capsule Take 75 mg by mouth 3 (three) times daily as needed for pain.      Marland Kitchen. lisinopril-hydrochlorothiazide (PRINZIDE,ZESTORETIC) 20-25 MG per tablet Take 1 tablet by mouth daily.      Marland Kitchen. omeprazole (PRILOSEC) 20 MG capsule Take 20 mg by mouth daily.       No facility-administered medications prior to visit.   Allergies  Allergen Reactions  . Other Itching    Patient is allergic to something and cant remember what it is.It was for pain.   Patient Active Problem List   Diagnosis Date Noted  . S/P appendectomy 02/08/2013  . HTN (hypertension) 02/08/2013     History  Substance Use Topics  . Smoking status: Current Every Day Smoker    Types: Cigarettes  . Smokeless tobacco: Never Used  . Alcohol Use: No   Family history is unknown by patient.     Objective:   Physical Exam Well-developed [place] female in no acute distress, accompanied by her son blood pressure 90/60 pulse 80 height 5 foot 3 weight 155. HEENT; nontraumatic normocephalic EOMI PERRLA sclera anicteric, Supple; no JVD, Cardiovascular; regular rate and rhythm with S1-S2 no murmur or gallop, Pulmonary; clear bilaterally, Abdomen ;soft bowel sounds are present no palpable mass or hepatosplenomegaly she is tender in the epigastrium and right upper quadrant and right mid quadrant there is no guarding or rebound, Rectal not done, Ext;no clubbing cyanosis or edema skin warm and dry, Psych; mood and affect appropriate  Assessment & Plan:  #27  43 year old [place] female with at least a 6 month history of epigastric and right upper quadrant pain with associated 10 pound weight loss. Previous upper abdominal ultrasound and CT scan were unremarkable. Will rule out peptic ulcer disease gastropathy or gastric lesion. #2 hypertension #3 status post appendectomy  Plan;  CBC with differential andCMET today Schedule for upper endoscopywith Dr. Garner Gavel discussed in detail with the patient and her son and  they're agreeable to proceed Change PPI to Protonix 40 mg by mouth twice daily If EGD is negative consider reimaging and colonoscopy.

## 2013-02-08 NOTE — Progress Notes (Signed)
Agree with initial assessment and plans as outlined 

## 2013-02-08 NOTE — Patient Instructions (Signed)
Please go to the basement level to have your labs drawn.   We sent a prescription for Pantoprazole sodium 40 mg. Take twice daily. (Take 1 tab in the morning and 1 tab before supper. )  You have been scheduled for an endoscopy with propofol. Please follow written instructions given to you at your visit today. If you use inhalers (even only as needed), please bring them with you on the day of your procedure.

## 2013-02-22 ENCOUNTER — Telehealth: Payer: Self-pay

## 2013-02-22 ENCOUNTER — Other Ambulatory Visit: Payer: Self-pay

## 2013-02-22 ENCOUNTER — Encounter: Payer: Self-pay | Admitting: Internal Medicine

## 2013-02-22 ENCOUNTER — Ambulatory Visit (AMBULATORY_SURGERY_CENTER): Payer: Medicaid Other | Admitting: Internal Medicine

## 2013-02-22 VITALS — BP 130/88 | HR 72 | Temp 96.4°F | Resp 20 | Ht 63.5 in | Wt 155.0 lb

## 2013-02-22 DIAGNOSIS — R109 Unspecified abdominal pain: Secondary | ICD-10-CM

## 2013-02-22 DIAGNOSIS — R634 Abnormal weight loss: Secondary | ICD-10-CM

## 2013-02-22 DIAGNOSIS — R1013 Epigastric pain: Secondary | ICD-10-CM

## 2013-02-22 LAB — GLUCOSE, CAPILLARY: GLUCOSE-CAPILLARY: 81 mg/dL (ref 70–99)

## 2013-02-22 MED ORDER — SODIUM CHLORIDE 0.9 % IV SOLN: 500.0000 mL | INTRAVENOUS | Status: DC

## 2013-02-22 NOTE — Patient Instructions (Addendum)
YOU HAD AN ENDOSCOPIC PROCEDURE TODAY AT THE [facility]: Refer to the procedure report that was given to you for any specific questions about what was found during the examination.  If the procedure report does not answer your questions, please call your gastroenterologist to clarify.  If you requested that your care partner not be given the details of your procedure findings, then the procedure report has been included in a sealed envelope for you to review at your convenience later.  YOU SHOULD EXPECT: Some feelings of bloating in the abdomen. Passage of more gas than usual.  Walking can help get rid of the air that was put into your GI tract during the procedure and reduce the bloating. If you had a lower endoscopy (such as a colonoscopy or flexible sigmoidoscopy) you may notice spotting of blood in your stool or on the toilet paper. If you underwent a bowel prep for your procedure, then you may not have a normal bowel movement for a few days.  DIET: Your first meal following the procedure should be a light meal and then it is ok to progress to your normal diet.  A half-sandwich or bowl of soup is an example of a good first meal.  Heavy or fried foods are harder to digest and may make you feel nauseous or bloated.  Likewise meals heavy in dairy and vegetables can cause extra gas to form and this can also increase the bloating.  Drink plenty of fluids but you should avoid alcoholic beverages for 24 hours.  ACTIVITY: Your care partner should take you home directly after the procedure.  You should plan to take it easy, moving slowly for the rest of the day.  You can resume normal activity the day after the procedure however you should NOT DRIVE or use heavy machinery for 24 hours (because of the sedation medicines used during the test).    SYMPTOMS TO REPORT IMMEDIATELY: A gastroenterologist can be reached at any hour.  During normal business hours, 8:30 AM to 5:00 PM Monday through Friday,  call [phone].  After hours and on weekends, please call the GI answering service at [phone] who will take a message and have the physician on call contact you.    Following upper endoscopy (EGD)  Vomiting of blood or coffee ground material  New chest pain or pain under the shoulder blades  Painful or persistently difficult swallowing  New shortness of breath  Fever of 100F or higher  Black, tarry-looking stools  FOLLOW UP: If any biopsies were taken you will be contacted by phone or by letter within the next 1-3 weeks.  Call your gastroenterologist if you have not heard about the biopsies in 3 weeks.  Our staff will call the home number listed on your records the next business day following your procedure to check on you and address any questions or concerns that you may have at that time regarding the information given to you following your procedure. This is a courtesy call and so if there is no answer at the home number and we have not heard from you through the emergency physician on call, we will assume that you have returned to your regular daily activities without incident.  SIGNATURES/CONFIDENTIALITY: You and/or your care partner have signed paperwork which will be entered into your electronic medical record.  These signatures attest to the fact that that the information above on your After Visit Summary has been reviewed and is understood.  Full   responsibility of the confidentiality of this discharge information lies with you and/or your care-partner.  CT scan contrast enhanced of the abdomen and pelvis for ongoing weight loss and abdominal pain. CT instructions given to pt's son along with oral contrast.  Establish primary care physician as soon as possible.

## 2013-02-22 NOTE — Op Note (Signed)
[facility] [address] [place], [id number]   ENDOSCOPY PROCEDURE REPORT  PATIENT: Renee Mccann, Renee L.  MR#: [id number] BIRTHDATE: [birth year] , 42  yrs. old GENDER: Female ENDOSCOPIST: [name], MD REFERRED BY:  .  Self / Office / ER PROCEDURE DATE:  02/22/2013 PROCEDURE:  EGD, diagnostic ASA CLASS:     Class II INDICATIONS:  abdominal pain for at least 8 months, weight loss. MEDICATIONS: MAC sedation, administered by CRNA and propofol (Diprivan) 150mg  IV TOPICAL ANESTHETIC: Cetacaine Spray  DESCRIPTION OF PROCEDURE: After the risks benefits and alternatives of the procedure were thoroughly explained, informed consent was obtained.  The LB [id number] endoscope was introduced through the mouth and advanced to the second portion of the duodenum. Without limitations.  The instrument was slowly withdrawn as the mucosa was fully examined.      The upper, middle and distal third of the esophagus were carefully inspected and no abnormalities were noted.  The z-line was well seen at the GEJ.  The endoscope was pushed into the fundus which was normal including a retroflexed view.  The antrum, gastric body, first and second part of the duodenum were unremarkable. Retroflexed views revealed no abnormalities.     The scope was then withdrawn from the patient and the procedure completed.  COMPLICATIONS: There were no complications. ENDOSCOPIC IMPRESSION: 1. Normal EGD 2. No explanation for abdominal pain or weight loss  RECOMMENDATIONS: 1. Schedule contrast-enhanced CT Scan of the abdomen and pelvis "ongoing abdominal pain and weight loss" 2. You need to establish with a primary care physician ASAP 3. All of the above discussed with the patient's son  REPEAT EXAM:  eSigned:  [name], MD 02/22/2013 3:48 PM   CC:The Patient

## 2013-02-22 NOTE — Telephone Encounter (Signed)
Pt scheduled for CT of abdomen and pelvis at [place] [facility] 02/28/13@1 :30pm. Pt to be NPO after 9:30am, drink bottle #1of contrast at 11:30am, Bottle #2 of contrast at 12:30pm. Pt should arrive there at 1:15pm. [facility] nurse to communicate this to the pt and give her the contrast.

## 2013-02-23 ENCOUNTER — Telehealth: Payer: Self-pay | Admitting: *Deleted

## 2013-02-23 NOTE — Telephone Encounter (Signed)
Called number given in admitting [phone] with no answer and states this number has a voice mail box that is not set up yet so unable to leave message. ewm

## 2013-02-28 ENCOUNTER — Other Ambulatory Visit: Payer: Medicaid Other

## 2013-02-28 ENCOUNTER — Inpatient Hospital Stay: Admission: RE | Admit: 2013-02-28 | Payer: Medicaid Other | Source: Ambulatory Visit

## 2013-03-09 ENCOUNTER — Ambulatory Visit (INDEPENDENT_AMBULATORY_CARE_PROVIDER_SITE_OTHER)
Admission: RE | Admit: 2013-03-09 | Discharge: 2013-03-09 | Disposition: A | Discharge status: Home / Self Care | Payer: Medicaid Other | Source: Ambulatory Visit | Attending: Internal Medicine | Admitting: Internal Medicine

## 2013-03-09 ENCOUNTER — Other Ambulatory Visit: Payer: Medicaid Other

## 2013-03-09 DIAGNOSIS — R109 Unspecified abdominal pain: Secondary | ICD-10-CM

## 2013-03-09 MED ORDER — IOHEXOL 300 MG/ML  SOLN
100.0000 mL | Freq: Once | INTRAMUSCULAR | Status: AC | PRN
  Administered 2013-03-09: 100 mL via INTRAVENOUS

## 2013-05-14 ENCOUNTER — Emergency Department (HOSPITAL_COMMUNITY)
Admission: EM | Admit: 2013-05-14 | Discharge: 2013-05-14 | Disposition: A | Discharge status: Home / Self Care | Payer: Medicaid Other | Attending: Emergency Medicine | Admitting: Emergency Medicine

## 2013-05-14 ENCOUNTER — Emergency Department (HOSPITAL_COMMUNITY): Payer: Medicaid Other

## 2013-05-14 ENCOUNTER — Encounter (HOSPITAL_COMMUNITY): Payer: Self-pay | Admitting: Emergency Medicine

## 2013-05-14 DIAGNOSIS — Z87448 Personal history of other diseases of urinary system: Secondary | ICD-10-CM | POA: Insufficient documentation

## 2013-05-14 DIAGNOSIS — S0101XA Laceration without foreign body of scalp, initial encounter: Secondary | ICD-10-CM

## 2013-05-14 DIAGNOSIS — M542 Cervicalgia: Secondary | ICD-10-CM

## 2013-05-14 DIAGNOSIS — S0100XA Unspecified open wound of scalp, initial encounter: Secondary | ICD-10-CM | POA: Insufficient documentation

## 2013-05-14 DIAGNOSIS — S79919A Unspecified injury of unspecified hip, initial encounter: Secondary | ICD-10-CM | POA: Insufficient documentation

## 2013-05-14 DIAGNOSIS — S79929A Unspecified injury of unspecified thigh, initial encounter: Secondary | ICD-10-CM

## 2013-05-14 DIAGNOSIS — S0993XA Unspecified injury of face, initial encounter: Secondary | ICD-10-CM | POA: Insufficient documentation

## 2013-05-14 DIAGNOSIS — Y92410 Unspecified street and highway as the place of occurrence of the external cause: Secondary | ICD-10-CM | POA: Insufficient documentation

## 2013-05-14 DIAGNOSIS — M25551 Pain in right hip: Secondary | ICD-10-CM

## 2013-05-14 DIAGNOSIS — Y9389 Activity, other specified: Secondary | ICD-10-CM | POA: Insufficient documentation

## 2013-05-14 DIAGNOSIS — F17200 Nicotine dependence, unspecified, uncomplicated: Secondary | ICD-10-CM | POA: Insufficient documentation

## 2013-05-14 DIAGNOSIS — Z8639 Personal history of other endocrine, nutritional and metabolic disease: Secondary | ICD-10-CM | POA: Insufficient documentation

## 2013-05-14 DIAGNOSIS — I10 Essential (primary) hypertension: Secondary | ICD-10-CM | POA: Insufficient documentation

## 2013-05-14 DIAGNOSIS — M545 Low back pain, unspecified: Secondary | ICD-10-CM

## 2013-05-14 DIAGNOSIS — Z79899 Other long term (current) drug therapy: Secondary | ICD-10-CM | POA: Insufficient documentation

## 2013-05-14 DIAGNOSIS — S199XXA Unspecified injury of neck, initial encounter: Principal | ICD-10-CM

## 2013-05-14 DIAGNOSIS — Z862 Personal history of diseases of the blood and blood-forming organs and certain disorders involving the immune mechanism: Secondary | ICD-10-CM | POA: Insufficient documentation

## 2013-05-14 DIAGNOSIS — IMO0002 Reserved for concepts with insufficient information to code with codable children: Secondary | ICD-10-CM | POA: Insufficient documentation

## 2013-05-14 MED ORDER — TRAMADOL HCL 50 MG PO TABS: 50.0000 mg | ORAL_TABLET | Freq: Four times a day (QID) | ORAL | Status: DC | PRN

## 2013-05-14 MED ORDER — TETANUS-DIPHTH-ACELL PERTUSSIS 5-2.5-18.5 LF-MCG/0.5 IM SUSP
0.5000 mL | Freq: Once | INTRAMUSCULAR | Status: DC
  Filled 2013-05-14: qty 0.5

## 2013-05-14 MED ORDER — TRAMADOL HCL 50 MG PO TABS
50.0000 mg | ORAL_TABLET | Freq: Once | ORAL | Status: AC
  Administered 2013-05-14: 50 mg via ORAL
  Filled 2013-05-14: qty 1
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
## 2013-05-14 NOTE — Discharge Instructions (Signed)
Take motrin or aleve as need. You may also take ultram as need for pain - no driving for the next 6 hours, or if/when taking ultram. Have staple removed from scalp laceration in 1 week, your doctor or urgent care. Return to ER if worse, new symptoms, severe pain, infection of wound, severe headache, other concern.     Ch?m Chili V?t Rch, Ng??i L?n (Laceration Care, Adult) V?t rch l m?t v?t c?t ho?c t?n th??ng lan r?ng qua t?t c? cc l?p c?a da v xuyn th?u xu?ng m d??i da.  ?I?U TR? M?t s? v?t rch c th? khng c?n ph?i khu l?i. M?t s? v?t rch c th? khng ?ng ???c do t?ng nguy c? b? nhi?m trng. B?n c?n g?p chuyn gia ch?m [place] s?c kh?e c?a mnh cng s?m cng t?t sau khi b? th??ng ?? gi?m thi?u nguy c? nhi?m trng v t?i ?a ha kh? n?ng khu v?t th??ng thnh cng. N?u khu l thch h?p, thu?c gi?m ?au c th? ???c ch? ??nh n?u c?n thi?t. V?t th??ng s? ???c lm s?ch ?? trnh nhi?m trng. Chuyn gia ch?m [place] s?c kh?e c?a b?n s? s? d?ng cc m?i khu (ch? khu), ghim khu, keo dn v?t th??ng (ch?t k?t dnh) ho?c d?i b?ng dnh da ?? s?a ch?a v?t rch. Nh?ng cng c? ??a cc mp da l?i g?n nhau ?? ph?c h?i nhanh h?n v k?t qu? th?m m? t?t h?n. Tuy nhin, t?t c? cc v?t th??ng s? lnh km theo m?t v?t s?o. Sau khi v?t th??ng ? lnh, s?o c th? ???c gi?m thi?u b?ng cch thoa kem ch?ng n?ng ln v?t th??ng vo ban ngy, trong su?t 1 n?m. H??NG D?N CH?M [place] T?I NH ??i v?i ch? khu ho?c ghim khu:  Gi? cho v?t th??ng s?ch v kh.  N?u ???c b?ng, b?n nn thay b?ng t nh?t m?t l?n m?i ngy. C?ng c?n thay b?ng n?u b?ng ??t ho?c b?n, ho?c theo ch? d?n c?a chuyn gia ch?m [place] s?c kh?e.  R?a v?t th??ng b?ng x phng v n??c 2 l?n m?i ngy. R?a s?ch v?t th??ng b?ng n??c ?? lo?i b? t?t c? x phng. Th?m kh v?t th??ng b?ng kh?n s?ch.  Sau khi lm s?ch, hy bi m?t l?p m?ng thu?c m? khng sinh theo khuy?n co c?a chuyn gia ch?m [place] s?c kh?e. Lm nh? v?y s? gip ng?n ng?a nhi?m trng v gi? b?ng khng b?  dnh vo v?t th??ng.  B?n c th? t?m nh? bnh th??ng sau 24 gi? ??u tin. Khng ngm v?t th??ng trong n??c cho ??n khi tho ch? khu.  Ch? s? d?ng thu?c khng c?n k toa ho?c thu?c c?n k toa ?? gi?m ?au, gi?m c?m gic kh ch?u ho?c h? s?t theo ch? d?n c?a chuyn gia ch?m [place] s?c kh?e c?a b?n.  C?t ch? ho?c tho b? ghim khu theo ch? d?n c?a chuyn gia ch?m  s?c kh?e. ??i v?i d?i b?ng dnh da:  Gi? cho v?t th??ng s?ch v kh.  [place] b?ng dnh da b? ??t. B?n c th? t?m m?t cch c?n th?n ?? gi? cho v?t th??ng kh.  N?u v?t th??ng b? ??t, hy th?m kh b?ng kh?n s?ch.  D?i b?ng dnh da s? t? r?i ra. B?n c th? c?t d?i b?ng khi v?t th??ng lnh. Khng tho d?i b?ng dnh da v?n cn b? dnh vo v?t th??ng. Chng s? r?i ra khi ??n lc. V?i keo dnh v?t th??ng:  B?n c th? lm ??t v?t th??ng d??i  vi sen ho?c trong t?m b?n trong giy lt. Khng ngm ho?c ch v?t th??ng. Khng b?i. Trnh nh?ng giai ?o?n ?? m? hi nhi?u cho ??n khi b?ng dnh da t? r?i ra. Sau khi t?m, hy nh? nhng th?m kh v?t th??ng b?ng kh?n s?ch.  Khng bi thu?c n??c, thu?c kem ho?c thu?c m? vo v?t th??ng trong khi b?ng dnh da v?n cn. Lm nh? v?y c th? l?ng l?p phim tr??c khi v?t th??ng ???c ch?a lnh.  N?u v?t th??ng ???c b?ng, hy c?n th?n khng b?ng tr?c ti?p ln trn ch?t k?t dnh da. Lm nh? v?y c th? khi?n cho ch?t k?t dnh b? r?i ra tr??c khi v?t th??ng ???c ch?a lnh.  Trnh ti?p xc ko di v?i nh sng m?t tr?i ho?c ?n nhu?m da trong khi cn ch?t k?t dnh da. Ti?p xc v?i tia c?c tm trong n?m ??u tin s? lm thm v?t s?o.  Ch?t k?t dnh da th??ng s? v?n ???c gi? nguyn t?i ch? trong 5 ??n 10 ngy, sau ? t? r?i ra kh?i da. Khng ch?c vo l?p phim k?t dnh. B?n c th? c?n ???c tim phng u?n vn n?u:  B?n khng th? nh? l?n tim phng u?n vn g?n ?y nh?t c?a b?n l khi no.  B?n ch?a bao gi? tim phng u?n vn. N?u b?n ???c tim phng u?n vn, cnh tay c?a b?n c th? b? s?ng, ?? v c?m th?y ?m khi  ch?m vo. ?i?u ny l ph? bi?n v khng ph?i l m?t v?n ??. N?u b?n c?n tim phng u?n vn v b?n ch?n khng tim, s? c nguy c? b? u?n vn, tuy hi?m hoi. B?nh do u?n vn c th? nghim tr?ng. HY ?I KHM N?U:  B?n b? t?y ??, s?ng ho?c ?au ngy cng t?ng trong v?t th??ng.  B?n th?y m?t ???ng mu ?? m xu?t pht t? v?t th??ng.  C ch?t l?ng mu vng tr?ng (m?) ch?y ra t? v?t th??ng c?a b?n.  B?n b? s?t.  B?n th?y mi hi thot ra t? v?t th??ng ho?c b?ng.  V?t th??ng c?a b?n b? h mi?ng tr??c ho?c sau khi tho ch?.  B?n nh?n th?y c ci g ? r?i ra t? v?t th??ng nh? g? ho?c th?y tinh.  V?t th??ng n?m trn bn tay ho?c bn chn c?a b?n v b?n khng th? di chuy?n m?t ngn tay ho?c ngn chn. HY NGAY L?P T?C ?I KHM N?U:  B?n b? ?au khng ki?m sot ???c b?ng thu?c ???c k ??n.  B?n b? s?ng n?ng xung quanh v?t th??ng gy ?au ??n v t ho?c thay ??i mu s?c trong cnh tay, tay, chn ho?c bn chn.  V?t th??ng c?a b?n b? h mi?ng v b?t ??u ch?y mu.  B?n b? t, y?u ngy m?t t? ho?c m?t ch?c n?ng c?a b?t k? kh?p no quanh ho?c bn ngoi v?t th??ng.  B?n c cc kh?i u gy ?au ??n g?n v?t th??ng ho?c trn b?t c? ch? da no trn c? th?. ??M B?O B?N:  Hi?u cc h??ng d?n ny.  S? theo di tnh tr?ng c?a mnh.  S? yu c?u tr? gip ngay l?p t?c n?u b?n c?m th?y khng ?? ho?c tnh tr?ng tr?m tr?ng h?n. Document Released: 12/30/2004 Document Revised: 09/01/2012 [facility] Patient Information 2014 [place], [place].     C?ng th?t l?ng cng ( Lumbosacral Strain) C?ng vng th?t l?ng cng l tnh tr?ng c?ng b?t k? b? ph?n no t?o nn nh?ng ??t s?ng th?t l?ng cng c?a  qu v?. Cc ??t s?ng th?t l?ng cng c?a qu v? l nh?ng ph?n x??ng t?o nn m?t ph?n ba pha d??i x??ng s?ng. Cc ??t s?ng th?t l?ng cng c?a qu v? ???c g?n v?i nhau b?ng cc c? v m x? ch?c ch?n (dy ch?ng).  NGUYN NHN.  M?t c ?nh b?t ng? vo l?ng c th? gy c?ng th?t l?ng cng. Ngoi ra, b?t k? ?i?u g lm c?ng cc c? ? th?t  l?ng qu m?c c?ng c th? gy ra ch?ng c?ng th?t l?ng cng ny. Tnh tr?ng ny th??ng th?y ? nh?ng ng??i g?ng s?c qu m?c, ng, nng v?t n?ng, g?p ng??i, ho?c ci xu?ng l?p ?i l?p l?i nhi?u l?n. CC Y?U T? NGUY C?  Cng vi?c ?i h?i s?c l?c.  Tham gia vo cc mn th? thao ??y ho?c ko ?i h?i ph?i v?n l?ng ??t ng?t (qu?n v?t, ?nh gn, bng chy).  Nng t?.  U?n cong qu m?c ph?n th?t l?ng.  Khung x??ng ch?u nghing v? pha tr??c.  Y?u c? l?ng ho?c y?u c? b?ng ho?c c? hai.  Gn kheo c?ng. D?U HI?U V TRI?U CH?NG  C?ng th?t l?ng cng c th? gy ?au ? vng b? ch?n th??ng ho?c c?n ?au di chuy?n (lan t?a) xu?ng chn qu v?.  CH?N ?ON Chuyn gia ch?m [place] s?c kh?e th??ng c th? ch?n ?on c?ng th?t l?ng cng b?ng cch khm th?c th?[place] m?t s? tr??ng h?p qu v? c th? c?n cc ki?m tra nh? ch?p X quang.  ?I?U TR?  Vi?c ?i?u tr? ch?n th??ng vng th?t l?ng ty thu?c vo nhi?u y?u t? m bc s? lm sng s? ph?i ?nh gi. Tuy nhin, h?u h?t vi?c ?i?u tr? s? bao g?m vi?c s? d?ng thu?c ch?ng vim. H??NG D?N CH?M [place] T?I NH   Trnh nh?ng ho?t ??ng th? ch?t n?ng (qu?n v?t, racquetball, l??t vn n??c) n?u qu v? khng c ?? tnh tr?ng th? l?c ?? th?c hi?n cc ho?t ??ng ?. ?i?u ny c th? lm tr?m tr?ng thm ho?c gy ra v?n ??.  N?u qu v? c m?t v?n ?? ? l?ng, hy trnh nh?ng mn th? thao ?i h?i ph?i c? ??ng c? th? ??t ng?t. B?i v ?i b? th??ng l nh?ng ho?t ??ng an ton h?n.  Duy tr t? th? thch h?p.  Duy tr cn n?ng c l?i cho s?c kh?e.  ??i v?i nh?ng tnh tr?ng c?p tnh, qu v? c th? ch??m ? l?nh ln vng b? th??ng.  Cho ? l?nh vo ti nh?a.  ?? kh?n t?m vo gi?a da v ti.  Ch??m ? l?nh ln vng b? th??ng trong kho?ng 20 pht, 2 - 3 l?n m?i ngy.  Khi vng th?t l?ng b?t ??u lnh, c th? t?p cc bi t?p ko c?ng ho?c t?ng c??ng s?c kh?e. ?I KHM N?U:  ?au l?ng tr? nn t? h?n.  Qu v? b? ?au l?ng n?ng v khng ?? khi dng thu?c. NGAY L?P T?C ?I KHM N?U:   Qu v? b? t, ?au  bu?t, y?u, ho?c cc v?n ?? khi c? ??ng tay ho?c chn.  C s? thay ??i trong vi?c ki?m sot ??i ti?n ho?c ti?u ti?n.  Qu v? c c?n ?au t?ng ln ? b?t k? vng no c?a c? th?, k? c? ? vng b?ng (b?ng).  Qu v? th?y kh th?, chng m?t, ho?c c?m th?y mu?n ng?t.  Qu v? c?m th?y kh ch?u ? d? dy (bu?n nn), nn (nn m?a), ho?c ?? m? hi.  Sander Nephew  v? th?y ??i m?u ngn chn ho?c chn, ho?c bn chn qu v? r?t l?nh. ??M B?O QU V?:   Hi?u r cc h??ng d?n ny.  S? theo di tnh tr?ng c?a mnh.  S? yu c?u tr? gip ngay l?p t?c n?u qu v? c?m th?y khng kh?e ho?c th?y tr?m tr?ng h?n. Document Released: 10/09/2004 Document Revised: 10/20/2012 [facility] Patient Information 2014 [place], [place].    ??ng gi?p (Contusion) ??ng gi?p l v?t thm tm su. ??ng gi?p l h?u qu? c?a ch?n th??ng gy ch?y mu d??i da. ??ng gi?p c th? chuy?n mu xanh, tm ho?c vng. Ch?n th??ng nh? s? ?? l?i v?t b?m d?p khng ?au, nh?ng nh?ng v?t ??ng d?p n?ng h?n c th? gy ?au ??n v s?ng trong m?t vi tu?n.  NGUYN NHN ??ng gi?p th??ng gy ra b?i ?nh ?n, ch?n th??ng ho?c l?c tc ??ng tr?c ti?p ln m?t vng c?a c? th?. TRI?U CH?NG  S?ng v t?y ?? vng b? th??ng.  Thm tm vng b? th??ng.  Nh?y c?m ?au v ?au nh?c vng b? th??ng.  ?au. CH?N ?ON Vi?c ch?n ?on c th? ???c ??a ra b?ng cch ki?m tra ti?n s? v khm th?c th?. C th? c?n ch?p X-quang, CT ho?c MRI ?? xc ??nh xem c b?t k? ch?n th??ng no lin quan, ch?ng h?n nh? gy x??ng. ?I?U TR? ?i?u tr? c? th? s? ph? thu?c vo vng c? th? b? th??ng. Ni chung, bi?n php ?i?u tr? ??ng gi?p t?t nh?t l ngh? ng?i, ch??m ?, nng cao v ch??m l?nh vng b? th??ng. Thu?c khng c?n k toa c?ng c th? ???c khuyn dng ?? ki?m sot c?n ?au. H?i chuyn gia ch?m [place] s?c kh?e v? cch ?i?u tr? no l t?t nh?t cho v?t ??ng gi?p c?a b?n. H??NG D?N CH?M [place] T?I NH  Ch??m ? l?nh ln vng b? th??ng.  Cho ? l?nh vo ti nh?a.  ??t kh?n t?m gi?a da v ti.  Ch??m ? l?nh  trong 15 ??n 20 pht, 3 ??n 4 l?n m?i ngy.  Ch? s? d?ng thu?c khng c?n k toa ho?c thu?c c?n k toa ?? gi?m ?au, gi?m c?m gic kh ch?u ho?c h? s?t theo ch? d?n c?a chuyn gia ch?m [place] I s?c kh?e c?a b?n. Chuyn gia ch?m  s?c kh?e c th? khuyn b?n trnh s? d?ng cc thu?c ch?ng vim (atpirin, ibuprofen v naproxen) trong 48 gi? v nh?ng thu?c ny c th? lm t?ng thm tm.  ?? vng b? th??ng ngh? ng?i.  N?u c th?, hy nng cao vng b? th??ng ln ?? lm gi?m s?ng. HY NGAY L?P T?C ?I KHM N?U:  B?n b? thm tm ho?c s?ng t?ng ln.  B?n b? ?au ngy cng nhi?u.  S?ng hay ?au nh?c khng thuyn gi?m khi dng thu?c. ??M B?O B?N:  Hi?u cc h??ng d?n ny.  S? theo di tnh tr?ng c?a mnh.  S? yu c?u tr? gip ngay l?p t?c n?u b?n c?m th?y khng ?? ho?c tnh tr?ng tr?m tr?ng h?n. Document Released: 10/09/2004 Document Revised: 09/01/2012 [facility] Patient Information 2014 [place], [place].   Bong gn c? (Cervical Sprain) Bong gn c? l t?n th??ng ? c? khi cc m x? kh?e (dy ch?ng) n?i cc x??ng c? c?a qu? v? b? gin ho?c rch. Bong gn c? c th? c m?c ?? t? nh? ??n n?ng. Bong gn c? n?ng c th? khi?n cho ??t s?ng c? khng ?n ??nh. ?i?u ny c th? d?n ??n t?n th??ng t?y s?ng v c th?  gy ra cc v?n ?? nghim tr?ng ??i v?i h? th?n kinh. Th?i gian ?? tnh tr?ng bong gn c? ?? h?n ty thu?c vo nguyn nhn v m?c ?? t?n th??ng. H?u h?t cc tr??ng h?p bong gn c? kh?i trong 1 ??n 3 tu?n. NGUYN NHN  Bong gn c? n?ng c th? do:   Cc t?n th??ng do ch?i mn th? thao ti?p xc (ch?ng h?n nh? bng ?, bng b?u d?c, v?t, khc cn c?u, ?ua  t, th? d?c, l?n, v thu?t, ho?c quy?n [place]).  Cc v? va ch?m xe  t.  Cc t?n th??ng do t?ng ho?c gi?m t?c ?? ??t ng?t. ?y l m?t t?n th??ng do chuy?n ??ng ??t ng?t c?a ??u v c? v? pha tr??c v pha sau.  T ng. Bong gn c? m?c ?? nh? c th? do:   Trong t? th? b?t ti?n, ch?ng h?n nh? k?p ?i?n tho?i gi?a tai v vai c?a qu v?.  Ng?i trn gh? khng c  h? tr? thch h?p.  Lm vi?c t?i m?t tr?m my tnh c thi?t k? km ch?t l??ng.  Nhn ln ho?c nhn xu?ng trong th?i gian di. TRI?U CH?NG   ?au, ?au nh?c, c?ng ho?c c?m gic ?au rt b?ng ? pha tr??c, pha sau ho?c hai bn c?. C?m gic kh ch?u ny c th? ti?n tri?n ngay l?p t?c sau khi b? t?n th??ng ho?c c th? ti?n tri?n ch?m, 24 gi? tr? ln sau khi b? t?n th??ng.  ?au ho?c c?m gic c?m ?au ? ph?n gi?a gy.  ?au vai ho?c l?ng trn.  Kh? n?ng c? ??ng c? b? h?n ch?.  ?au ??u.  Chng m?t.  Y?u, t ho?c ?au bu?t ? bn tay ho?c cnh tay.  Co th?t c?.  Kh nu?t ho?c kh nhai.  C?m gic ?au v s?ng ? c?. CH?N ?ON  Chuyn gia ch?m [place] s?c kh?e ph?n l?n c th? ch?n ?on bong gn c? b?ng cch khai thc ti?n s? c?a qu v? v khm th?c th?Paulino Rily gia ch?m [place] s?c kh?e s? h?i v? cc t?n th??ng ? c? tr??c ?y v b?t k? v?n ?? no ? c? ? bi?t, ch?ng h?n nh? vim kh?p ? c?. C th? ch?p X-quang ?? xc ??nh xem c b?t k? v?n ?? no khc khng, ch?ng h?n nh? v?i x??ng c?. C?ng c th? c?n lm cc ki?m tra khc nh? ch?p CT ho?c MRI.  ?I?U TR?  ?i?u tr? ph? thu?c vo m?c ?? n?ng c?a bong gn c?. Bong gn m?c ?? nh? c th? ???c ?i?u tr? b?ng cch ngh? ng?i, gi? c? ? t? th? (c? ??nh) v thu?c gi?m ?au. Bong gn c? m?c ?? n?ng c?n ???c c? ??nh ngay l?p t?c. Vi?c ?i?u tr? ???c ti?p t?c ?? gip lm gi?m ?au, co th?t c? cc tri?u ch?ng khc v c th? bao g?m:  Thu?c, ch?ng h?n nh? thu?c gi?m ?au, thu?c t ho?c thu?c lm gin c?.  V?t l tr? li?u. Tr? li?u ny c th? bao g?m cc bi t?p ko dn, bi t?p t?ng c??ng s?c b?n v t?p theo t? th?. Bi t?p v c?i thi?n t? th? c th? gip ?n ??nh c?, t?ng c??ng c? b?p v gip ng?n ch?n cc tri?u ch?ng quay tr? l?i. H??NG D?N CH?M [place] T?I NH   Ch??m ? l?nh ln vng b? th??ng.  Cho ? l?nh vo ti nh?a.  ?? kh?n t?m vo gi?a da v ti.  ?? ? l?nh trong kho?ng 15-20  pht, 3-4 l?n m?i ngy.  N?u t?n th??ng n?ng, qu v? c th? ???c ?eo m?t vng n?p c? Vng  n?p c? l m?t vng g?m hai m?nh ???c thi?t k? ?? gi? cho c? qu v? khng c? ??ng trong qu trnh h?i ph?c.  Khng tho vng n?p c?, tr? khi ???c chuyn gia ch?m [place] s?c kh?e h??ng d?n.  N?u tc qu v? di, hy cho tc ra bn ngoi vng n?p c?.  H?i chuyn gia ch?m [place] s?c kh?e tr??c khi ?i?u ch?nh b?t k? ph?n no c?a vng n?p c?. C th? c?n ?i?u ch?nh m?t cht cc ph?n theo th?i gian ?? c c?m gic d? ch?u h?n v lm gi?m chn p ln c?m ho?c ln pha sau ??u.  N?uqu v? ???c php tho vng n?p ra ?? v? sinh ho?c t?m r?a, hy lm theo ch? d?n c?a chuyn gia ch?m [place] s?c kh?e v? cch th?c hi?n sao cho an ton.  Gi? cho vng n?p c? s?ch s? b?ng cch lau r?a b?ng x phng nh? v?i n??c v ph?i kh hon ton. N?u vng n?p c? ??a cho b?n dng c nh?ng mi?ng ??m c th? tho r?i, hy tho cc mi?ng ??m ? ra sau m?i 1 - 2 ngy m?t l?n v r?a cc mi?ng ??m b?ng tay v?i x phng v n??c. ?? cho cc mi?ng ??m t? kh. Cc mi?ng ??m ph?i kh hon ton tr??c khi qu v? l?p chng vo vng n?p c?.  N?u qu v? ???c php tho vng n?p c? ra ?? lm v? sinh v t?m r?a, hy r?a v lm kh ph?n da c? c?a qu v?. Ki?m tra da xem c b? kch ?ng ho?c l? lot khng. N?u c kch ?ng ho?c l? lot, hy ni cho chuyn gia ch?m [place] s?c kh?e bi?t.  Khng li xe trong khi ?eo vng n?p c?.  Ch? s? d?ng thu?c khng c?n k ??n ho?c thu?c c?n k ??n ?? gi?m ?au, gi?m c?m gic kh ch?u ho?c h? s?t theo ch? d?n c?a chuyn gia ch?m [place] s?c kh?e c?a qu v?.  Tun th? m?i cu?c h?n khm l?i theo ch? d?n c?a chuyn gia ch?m [place] s?c kh?e.  Tun th? m?i cu?c h?n lm v?t l tr? li?u theo ch? d?n c?a chuyn gia ch?m [place] s?c kh?e.  Th?c hi?n b?t c? ?i?u ch?nh no c?n thi?t ? ch? lm vi?c c?a qu v? ?? c t? th? ?ng.  Trnh nh?ng t? th? v ho?t ??ng lm cho cc tri?u ch?ng c?a qu v? t?i t? h?n.  Kh?i ??ng v ko dn tr??c khi ho?t ??ng ?? gip ng?n ng?a cc v?n ?? x?y ra. ?I KHM N?U:   C?n ?au c?a qu v? khng ki?m sot ???c b?ng  thu?c.  Qu v? khng th? gi?m l??ng thu?c gi?m ?au theo th?i gian theo k? ho?ch.  M?c ?? ho?t ??ng c?a qu v? khng c?i thi?n nh? mong ??i. NGAY L?P T?C ?I KHM N?U:   Qu v? b? ch?y mu ? b?t c? ?u.  Qu v? c c?m gic bu?n nn.  Qu v? c cc d?u hi?u b? m?t ph?n ?ng d? ?ng v?i thu?c.  Tri?u ch?ng c?a qu v? n?ng h?n.  Qu v? m?i c nh?ng tri?u ch?ng khng r nguyn nhn.  Qu v? b? t, ?au nhi, y?u ho?c li?t b?t c? ph?n no c?a c? th?. ??M B?O QY V?:   Hi?u cc h??ng d?n ny.  S? theo di tnh  tr?ng c?a mnh.  S? yu c?u tr? gip ngay l?p t?c n?u b?n c?m th?y khng kh?e ho?c th?y tr?m tr?ng h?n. Document Released: 12/19/2010 Document Revised: 10/20/2012 [facility] Patient Information 2014 [place], [place].   Ch?n th??ng ??u, Ng??i l?n (Head Injury, Adult) Qu v? b? m?t ch?n th??ng ? ??u. Ch?n th??ng khng c v? nghim tr?ng vo th?i ?i?m ny. ?au ??u v nn m?a l ph? bi?n sau ch?n th??ng ??u. Qu v? d? b? th?c d?y v c?n ?au. ?i khi qu v? c?n ? l?i phng c?p c?u m?t th?i gian ?? quan st. ?i khi qu v? c th? c?n nh?p vi?n. Sau nh?ng ch?n th??ng nh? ch?n th??ng c?a qu v?, h?u h?t cc v?n ?? x?y ra trong vng 24 gi? ??u, nh?ng ?nh h??ng ph? c th? x?y ra trong 7 ??n 10 ngy sau ch?n th??ng. ?i?u quan tr?ng l qu v? c?n theo di st tnh tr?ng c?a mnh v lin l?c v?i chuyn gia ch?m [place] s?c kh?e ho?c ?i khm ngay l?p t?c n?u tnh tr?ng c?a qu v? thay ??i. C NH?NG LO?I CH?N TH??NG ??U NO? Ch?n th??ng ??u c th? nh? do m?t va ch?m. M?t s? ch?n th??ng ??u c th? n?ng h?n. Nh?ng ch?n th??ng ??u n?ng h?n bao g?m:  Ch?n th??ng gy chong no (ch?n ??ng).  B?m gi?p no (??ng gi?p). ?i?u ny c ngh?a l c ch?y mu no c th? gy s?ng n?.  N?t x??ng s? (r?n x??ng s?).  Mu ch?y trong no b? ??ng l?i, ?ng c?c, v hnh thnh m?t c?c (t? mu). NGUYN NHN CH?N TH??NG ??U L G? Ch?n th??ng n?ng ? ??u th??ng x?y ra v?i ng??i ng?i trong xe b? tai n?n v khng ?eo dy an  ton. Nh?ng nguyn nhn khc d?n ??n ch?n th??ng ??u m?c ?? n?ng bao g?m tai n?n xe ??p ho?c xe my, ch?n th??ng do ch?i th? thao v b? ng. CH?N TH??NG ??U ???C CH?N ?ON NH? TH? NO? Ton b? ti?n s? c?a s? ki?n d?n ??n ch?n th??ng v cc tri?u ch?ng hi?n t?i c?a qu v? s? h?u ch cho vi?c ch?n ?on ch?n th??ng ??u. \Nhi?u khi c?n ph?i ch?p no, ch?ng h?n nh? ch?p CT ho?c MRI, ?? xem m?c ?? th??ng t?n. Thng th??ng c?n ph?i ? l?i b?nh vi?n qua ?m ?? theo di.  KHI NO TI C?N ?I KHM NGAY L?P T?C?  Qu v? c?n ???c tr? gip ngay n?u:  Qu v? b? l l?n ho?c bu?n ng?.  Qu v? c?m th?y kh ch?u trong d? dy (bu?n nn) ho?c lin t?c nn m?a nhi?u.  Qu v? b? chng m?t ho?c ??ng khng v?ng, ?i?u ny tr? nn t? h?n.  Qu v? b? ?au ??u n?ng, lin t?c khng thuyn gi?m sau khi s? d?ng thu?c. Ch? s? d?ng thu?c khng c?n k ??n ho?c thu?c c?n k ??n ?? gi?m ?au, h? s?t, ho?c gi?m c?m gic kh ch?u theo ch? d?n c?a chuyn gia ch?m [place] s?c kh?e c?a qu v?.  Qu v? khng c? ??ng ???c tay ho?c chn nh? bnh th??ng ho?c khng th? ?i l?i.  Qu v? th?y thay ??i cc ?i?m ?en ? trung tm c?a ph?n c mu c?a m?t (??ng t?).  C d?ch trong ho?c l?n mu ch?y ra kh?i m?i ho?c tai c?a qu v?.  Qu v? khng nhn th?y g. Trong vng 24 gi? sau khi b? ch?n th??ng, qu v? ph?i ? cng v?i m?t ng??i no ? c  th? theo di qu v? ?? pht hi?n nh?ng d?u hi?u c?nh bo. Ng??i ny c?n lin l?c v?i d?ch v? c?p c?u ? ??a ph??ng (911 ? M?) n?u qu v? b? co gi?t, qu v? b? b?t t?nh, ho?c qu v? khng th? t?nh l?i ???c. TI C TH? NG?N NG?A CH?N TH??NG ??U TRONG T??NG LAI NH? TH? NO? Y?u t? quan tr?ng nh?t trong vi?c ng?n ng?a ch?n th??ng n?ng ? ??u l trnh b? tai n?n xe c?. ?? gi?m thi?u kh? n?ng th??ng t?n ? ??u, ?i?u quan tr?ng nh?t l ph?i ?eo dy b?o hi?m khi ?i xe. ??i m? b?o hi?m khi ?i xe my v khi ch?i cc mn th? thao ??i khng (nh? bng ?) c?ng l h?u ch. Ngoi ra, vi?c trnh nh?ng ho?t ??ng nguy hi?m xung quanh nh  c?ng s? gip gi?m nguy c? ch?n th??ng ??u.  KHI NO TI C TH? TR? L?I HO?T ??NG V T?P TH? THAO BNH TH??NG? Qu v? c?n ???c chuyn gia ch?m [place] s?c kh?e khm l?i tr??c khi tr? l?i nh?ng ho?t ??ng ny. N?u qu v? c b?t k? tri?u ch?ng no d??i ?y, qu v? khng nn th?c hi?n cc ho?t ??ng ho?c ch?i th? thao ??i khng tr? l?i cho ??n khi ???c 1 tu?n sau khi h?t nh?ng tri?u ch?ng ny.  ?au ??u lin [place] m?t ho?c chng m?t.  Phn tm v thi?u t?p trung.  B? l l?n.  V?n ?? tr nh?Marland Kitchen  Bu?n nn ho?c nn m?a.  M?t m?i ho?c d? m?t m?i.  D? b? kch thch.  Khng ch?u ???c nh sng m?nh ho?c ti?ng ?n l?n.  Lo l?ng ho?c tr?m c?m.  Ng? khng su. ??M B?O QU V?:   Hi?u r cc h??ng d?n ny.  S? theo di tnh tr?ng c?a mnh.  S? yu c?u tr? gip ngay l?p t?c n?u qu v? c?m th?y khng kh?e ho?c th?y tr?m tr?ng h?n. Document Released: 12/30/2004 Document Revised: 10/20/2012 [facility] Patient Information 2014 [place], [place].
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
## 2013-05-15 NOTE — ED Provider Notes (Signed)
Medical screening examination/treatment/procedure(s) were conducted as a shared visit with non-physician practitioner(s) and myself.  I personally evaluated the patient during the encounter.   EKG Interpretation None      Pt primarily seen by me - see H and P.   Wound stapled by PA, wound edges well approximated, no bleeding.   Suzi RootsKevin E Zackarey Holleman, MD 05/15/13 [phone]

## 2013-05-23 ENCOUNTER — Encounter (HOSPITAL_COMMUNITY): Payer: Self-pay | Admitting: Emergency Medicine

## 2013-05-23 ENCOUNTER — Emergency Department (HOSPITAL_COMMUNITY)
Admission: EM | Admit: 2013-05-23 | Discharge: 2013-05-23 | Disposition: A | Discharge status: Home / Self Care | Payer: No Typology Code available for payment source | Attending: Emergency Medicine | Admitting: Emergency Medicine

## 2013-05-23 DIAGNOSIS — Z4802 Encounter for removal of sutures: Secondary | ICD-10-CM

## 2013-05-23 DIAGNOSIS — Z79899 Other long term (current) drug therapy: Secondary | ICD-10-CM | POA: Insufficient documentation

## 2013-05-23 DIAGNOSIS — Z87448 Personal history of other diseases of urinary system: Secondary | ICD-10-CM | POA: Insufficient documentation

## 2013-05-23 DIAGNOSIS — F17200 Nicotine dependence, unspecified, uncomplicated: Secondary | ICD-10-CM | POA: Insufficient documentation

## 2013-05-23 DIAGNOSIS — Z8639 Personal history of other endocrine, nutritional and metabolic disease: Secondary | ICD-10-CM | POA: Insufficient documentation

## 2013-05-23 DIAGNOSIS — I10 Essential (primary) hypertension: Secondary | ICD-10-CM | POA: Insufficient documentation

## 2013-05-23 DIAGNOSIS — Z862 Personal history of diseases of the blood and blood-forming organs and certain disorders involving the immune mechanism: Secondary | ICD-10-CM | POA: Insufficient documentation

## 2013-05-23 NOTE — ED Provider Notes (Signed)
CSN: [id number]     Arrival date & time 05/23/13  1431 History  This chart was scribed for non-physician practitioner, Santiago GladHeather Windsor Goeken, PA-C working with Celene KrasJon R Knapp, MD by Greggory StallionKayla Andersen, ED scribe. This patient was seen in room WTR5/WTR5 and the patient's care was started at 2:48 PM.   Chief Complaint  Patient presents with  . Suture / Staple Removal   The history is provided by the patient. A language interpreter was used (pt's family member).   HPI Comments: Renee Mccann is a 43 y.o. female who presents to the Emergency Department for suture removal. Pt had one staple placed in her scalp 9 days ago. She states she still has some aching pain around the area. Denies any drainage. Denies fever, chills.   Past Medical History  Diagnosis Date  . Hypertension   . Pyelonephritis   . Hypokalemia    History reviewed. No pertinent past surgical history. History reviewed. No pertinent family history. History  Substance Use Topics  . Smoking status: Current Every Day Smoker    Types: Cigarettes  . Smokeless tobacco: Never Used  . Alcohol Use: No   OB History   Grav Para Term Preterm Abortions TAB SAB Ect Mult Living                 Review of Systems  Constitutional: Negative for fever and chills.  Skin: Positive for wound (healing).  All other systems reviewed and are negative.  Allergies  Other  Home Medications   Prior to Admission medications   Medication Sig Start Date End Date Taking? Authorizing Provider  lisinopril-hydrochlorothiazide (PRINZIDE,ZESTORETIC) 20-25 MG per tablet Take 1 tablet by mouth daily.    Historical Provider, MD  loratadine (CLARITIN) 10 MG tablet Take 10 mg by mouth daily.    Historical Provider, MD  traMADol (ULTRAM) 50 MG tablet Take 1 tablet (50 mg total) by mouth every 6 (six) hours as needed. 05/14/13   Suzi RootsKevin E Steinl, MD   BP 137/73  Pulse 66  Temp(Src) 98.3 F (36.8 C) (Oral)  Resp 12  SpO2 100%  Physical Exam  Nursing note and vitals  reviewed. Constitutional: She appears well-developed and well-nourished.  HENT:  Head: Normocephalic and atraumatic.  Mouth/Throat: Oropharynx is clear and moist.  Staple intact of the right scalp. No surrounding erythema or edema. No drainage.   Eyes: EOM are normal. Pupils are equal, round, and reactive to light.  Neck: Normal range of motion. Neck supple.  Cardiovascular: Normal rate, regular rhythm and normal heart sounds.   Pulmonary/Chest: Effort normal and breath sounds normal. She has no wheezes.  Musculoskeletal: Normal range of motion.  Neurological: She is alert.  Skin: Skin is warm and dry.  Psychiatric: She has a normal mood and affect. Her behavior is normal.    ED Course  Procedures (including critical care time)  DIAGNOSTIC STUDIES: Oxygen Saturation is 100% on RA, normal by my interpretation.    COORDINATION OF CARE: 2:49 PM-Discussed treatment plan which includes staple removal with pt at bedside and pt agreed to plan.   SUTURE REMOVAL Performed by: Santiago GladHeather Karma Ansley, PA-C Authorized by: Santiago GladHeather Maddox Hlavaty, PA-C Consent: Verbal consent obtained. Consent given by: patient Required items: required blood products, implants, devices, and special equipment available  Time out: Immediately prior to procedure a "time out" was called to verify the correct patient, procedure, equipment, support staff and site/side marked as required. Location: right scalp Wound Appearance: clean, no erythema, no edema, no drainage  Staples  Removed: 1 Patient tolerance: Patient tolerated the procedure well with no immediate complications.   Labs Review Labs Reviewed - No data to display  Imaging Review No results found.   EKG Interpretation None      MDM   Final diagnoses:  None   Patient presents today for staple removal.  No signs of infection.  Staple removed without difficulty.  Patient stable for discharge.    I personally performed the services described in this  documentation, which was scribed in my presence. The recorded information has been reviewed and is accurate.  Santiago GladHeather Bane Hagy, PA-C 05/23/13 1456

## 2013-05-23 NOTE — Discharge Instructions (Signed)
  Staple Removal Care After The staples used to close your skin have been removed. The wound needs continued care so it can heal completely and without problems. The care described here will need to be done for another 5-10 days unless your caregiver advises otherwise.  HOME CARE INSTRUCTIONS   Keep wound site dry and clean.  If skin adhesive strips were applied after the staples were removed, they will begin to peel off in a few days. If they remain after fourteen days, they may be peeled off and discarded.  If you still have a dressing, change it at least once a day or as instructed by your caregiver. If the bandage sticks, soak it off with warm water. Pat dry with a clean towel. Look for signs of infection (see below).  Reapply cream or ointment according to your caregiver's instruction. This will help prevent infection and keep the bandage from sticking. Use of a non-stick material over the wound and under the dressing or wrap will also help keep the bandage from sticking.  If the bandage becomes wet, dirty or develops a foul smell, change it as soon as possible.  New scars become sunburned easily. Use sunscreens with protection factor (SPF) of at least 15 when out in the sun.  Only take over-the-counter or prescription medicines for pain, discomfort or fever as directed by your caregiver. SEEK IMMEDIATE MEDICAL CARE IF:   There is redness, swelling or increasing pain in the wound.  Pus is coming from the wound.  An unexplained oral temperature above 102 F (38.9 C) develops.  You notice a foul smell coming from the wound or dressing.  There is a breaking open of the suture line (edges not staying together) of the wound edges after staples have been removed. Document Released: 12/13/2007 Document Revised: 03/24/2011 Document Reviewed: 12/13/2007 [employer] Patient Information 2014 [employer].  

## 2013-05-23 NOTE — ED Notes (Signed)
Pt to ED for staple removal.

## 2013-05-23 NOTE — ED Provider Notes (Signed)
Medical screening examination/treatment/procedure(s) were performed by non-physician practitioner and as supervising physician I was immediately available for consultation/collaboration.   Torence Palmeri R Kary Colaizzi, MD 05/23/13 1628 

## 2013-06-16 ENCOUNTER — Ambulatory Visit: Payer: No Typology Code available for payment source | Attending: Internal Medicine | Admitting: Physical Therapy

## 2013-07-05 ENCOUNTER — Ambulatory Visit: Payer: Medicaid Other | Admitting: Physical Therapy

## 2014-06-28 ENCOUNTER — Other Ambulatory Visit: Payer: Self-pay

## 2014-06-28 ENCOUNTER — Emergency Department (HOSPITAL_COMMUNITY): Payer: Medicaid Other

## 2014-06-28 ENCOUNTER — Emergency Department (HOSPITAL_COMMUNITY)
Admission: EM | Admit: 2014-06-28 | Discharge: 2014-06-28 | Disposition: A | Discharge status: Home / Self Care | Payer: Medicaid Other | Attending: Emergency Medicine | Admitting: Emergency Medicine

## 2014-06-28 ENCOUNTER — Encounter (HOSPITAL_COMMUNITY): Payer: Self-pay | Admitting: *Deleted

## 2014-06-28 DIAGNOSIS — R55 Syncope and collapse: Secondary | ICD-10-CM | POA: Diagnosis not present

## 2014-06-28 DIAGNOSIS — Z720 Tobacco use: Secondary | ICD-10-CM | POA: Diagnosis not present

## 2014-06-28 DIAGNOSIS — Z87448 Personal history of other diseases of urinary system: Secondary | ICD-10-CM | POA: Insufficient documentation

## 2014-06-28 DIAGNOSIS — F410 Panic disorder [episodic paroxysmal anxiety] without agoraphobia: Secondary | ICD-10-CM | POA: Insufficient documentation

## 2014-06-28 DIAGNOSIS — R079 Chest pain, unspecified: Secondary | ICD-10-CM | POA: Diagnosis present

## 2014-06-28 DIAGNOSIS — Z8639 Personal history of other endocrine, nutritional and metabolic disease: Secondary | ICD-10-CM | POA: Insufficient documentation

## 2014-06-28 DIAGNOSIS — Z79899 Other long term (current) drug therapy: Secondary | ICD-10-CM | POA: Diagnosis not present

## 2014-06-28 DIAGNOSIS — I10 Essential (primary) hypertension: Secondary | ICD-10-CM | POA: Diagnosis not present

## 2014-06-28 LAB — COMPREHENSIVE METABOLIC PANEL
ALBUMIN: 3.4 g/dL — AB (ref 3.5–5.0)
ALT: 24 U/L (ref 14–54)
AST: 25 U/L (ref 15–41)
Alkaline Phosphatase: 82 U/L (ref 38–126)
Anion gap: 7 (ref 5–15)
BUN: 10 mg/dL (ref 6–20)
CALCIUM: 8.7 mg/dL — AB (ref 8.9–10.3)
CHLORIDE: 109 mmol/L (ref 101–111)
CO2: 25 mmol/L (ref 22–32)
CREATININE: 0.79 mg/dL (ref 0.44–1.00)
GFR calc non Af Amer: >60 mL/min (ref >60)
Glucose, Bld: 121 mg/dL — ABNORMAL HIGH (ref 65–99)
Potassium: 3.3 mmol/L — ABNORMAL LOW (ref 3.5–5.1)
Sodium: 141 mmol/L (ref 135–145)
Total Bilirubin: 0.3 mg/dL (ref 0.3–1.2)
Total Protein: 6.1 g/dL — ABNORMAL LOW (ref 6.5–8.1)

## 2014-06-28 LAB — CBC WITH DIFFERENTIAL/PLATELET
BASOS ABS: 0 10*3/uL (ref 0.0–0.1)
Basophils Relative: 0 % (ref 0–1)
EOS ABS: 0.1 10*3/uL (ref 0.0–0.7)
Eosinophils Relative: 2 % (ref 0–5)
HCT: 37.9 % (ref 36.0–46.0)
HEMOGLOBIN: 12.2 g/dL (ref 12.0–15.0)
LYMPHS ABS: 2.7 10*3/uL (ref 0.7–4.0)
LYMPHS PCT: 41 % (ref 12–46)
MCH: 26.8 pg (ref 26.0–34.0)
MCHC: 32.2 g/dL (ref 30.0–36.0)
MCV: 83.3 fL (ref 78.0–100.0)
MONOS PCT: 3 % (ref 3–12)
Monocytes Absolute: 0.2 10*3/uL (ref 0.1–1.0)
NEUTROS ABS: 3.6 10*3/uL (ref 1.7–7.7)
NEUTROS PCT: 54 % (ref 43–77)
Platelets: 204 10*3/uL (ref 150–400)
RBC: 4.55 MIL/uL (ref 3.87–5.11)
RDW: 14.7 % (ref 11.5–15.5)
WBC: 6.6 10*3/uL (ref 4.0–10.5)

## 2014-06-28 LAB — I-STAT TROPONIN, ED: Troponin i, poc: 0.02 ng/mL (ref 0.00–0.08)
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
## 2014-06-28 NOTE — Discharge Instructions (Signed)
C?n ho?ng s? °(Panic Attacks) °C?n ho?ng s? là s? dâng trào c?m giác lo l?ng, s? hãi ho?c khó ch?u ??t ng?t trong th?i gian ng?n. Các c?n ho?ng s? có th? x?y ra không có nguyên nhân, khi quý v? th? giãn, khi quý v? lo l?ng, ho?c khi quý v? ng?. Các c?n ho?ng s? có th? x?y ra do m?t s? nguyên nhân:  °· Nh?ng ng??i kh?e m?nh ?ôi khi có nh?ng c?n ho?ng s? thái quá, nh?ng tình hu?ng ?e d?a tính m?ng, ch?ng h?n nh? chi?n tranh ho?c th?m h?a thiên nhiên. C?m giác lo l?ng thông th??ng là m?t c? ch? b?o v? c?a c? th?, giúp chúng ta ph?n ?ng v?i nguy hi?m (chi?n ??u ho?c ph?n ?ng chi?n ??u). °· C?n ho?ng s? th??ng ???c th?y khi có nh?ng r?i lo?n v? lo l?ng, ch?ng h?n nh? r?i lo?n ho?ng s?, r?i lo?n lo l?ng xã h?i, r?i lo?n lo l?ng chung, và s? ám ?nh. R?i lo?n lo l?ng gây lo l?ng thái quá ho?c lo l?ng không ki?m soát ???c. Nó có th? làm ?nh h??ng ??n các m?i quan h? c?a quý v? ho?c các ho?t ??ng khác trong cu?c s?ng. °· C?n ho?ng s? ?ôi khi th?y ? nh?ng b?nh tâm th?n khác, ch?ng h?n nh? tr?m c?m và r?i lo?n c?ng th?ng sau ch?n th??ng. °· M?t s? tình tr?ng n?i khoa nh?t ??nh, thu?c c?n kê ??n, và l?m d?ng ma túy có th? gây ra các c?n ho?ng s?. °TRI?U CH?NG  °C?n ho?ng s? có th? kh?i phát b?t ng?, ??t cao trào trong 20 phút, và kèm theo ít nh?t 4 trong s? các tri?u ch?ng sau: °· Tim ??p thình th?ch ho?c nh?p tim nhanh (?ánh tr?ng ng?c). °· ?? m? hôi. °· Run s? ho?c run r?y. °· Khó th? ho?c c?m giác ng?t th?. °· C?m giác ngh?t th? °· ?au ng?c ho?c khó ch?u. °· Bu?n nôn ho?c c?m giác l? trong d? dày. °· Chóng m?t, choáng váng, ho?c c?m th?y nh? s? b? ng?t. °· ?n l?nh ho?c nóng b?ng. °· Tê bì ho?c ?au bu?t ? môi ho?c bàn tay và bàn chân. °· C?m th?y m?i th? không th?t ho?c c?m th?y quý v? không ph?i là chính mình. °· S? m?t ki?m soát ho?c tr? nên ?iên khùng. °· S? ch?t. °M?t vài trong s? nh?ng tri?u ch?ng này có th? gi?ng v?i các tình tr?ng b?nh n?i khoa nghiêm tr?ng. Ch?ng h?n nh?, quý v? có th? ngh? mình ?ang b? c?n ?au tim.  M?c dù các c?n ho?ng s? có th? r?t ?áng s?, nh?ng chúng không ?e d?a tính m?ng. °CH?N ?OÁN  °C?n ho?ng s? ???c chuyên gia ch?m sóc s?c kh?e ch?n ?oán thông qua m?t ?ánh giá. Chuyên gia ch?m sóc s?c kh?e s? h?i v? các tri?u ch?ng c?a quý v?, ch?ng h?n nh? các c?n ho?ng s? ?ó x?y ra ? ?âu và khi nào. Chuyên gia ch?m sóc s?c kh?e c?ng s? h?i v? b?nh s? c?a quý v? và vi?c s? d?ng r??u và ma túy, bao g?m c? thu?c có kê ??n. Chuyên gia ch?m sóc s?c kh?e có th? yêu c?u làm xét nghi?m máu ho?c khám xét khác ?? lo?i tr? m?t tình tr?ng b?nh n?i khoa nghiêm tr?ng. Chuyên gia ch?m sóc s?c kh?e có th? gi?i thi?u quý v? ??n m?t chuyên gia s?c kh?e tâm th?n ?? ?ánh giá ti?p. °?I?U TR?  °· H?u h?t nh?ng ng??i kh?e m?nh có m?t ho?c hai c?n   ho?ng s? thi qu, tnh tr?ng ?e d?a tnh m?ng s? khng c?n ?i?u tr?Marland Kitchen  Vi?c ?i?u tr? c?n ho?ng s? k?t h?p v?i r?i lo?n v? lo l?ng ho?c b?nh tm th?n khc th??ng lin quan ??n vi?c t? v?n m?t chuyn gia s?c kh?e tm th?n, dng thu?c, ho?c k?t h?p c? hai. Chuyn gia ch?m [place] s?c kh?e s? gip xc ??nh bi?n php ?i?u tr? no t?t nh?t cho qu v?.  Cc c?n ho?ng s? do b?nh th?c th? th??ng s? h?t khi ?i?u tr? b?nh ?. N?u thu?c k ??n gy ra c?n ho?ng s?, hy ni chuy?n v?i chuyn gia ch?m [place] s?c kh?e v? vi?c ng?ng thu?c, gi?m li?u ho?c thay th? b?ng m?t lo?i thu?c khc.  C?n ho?ng s? do l?m d?ng r??u ho?c ma ty s? h?t khi king nh?ng th? ny. M?t s? ng??i l?n c?n tr? gip c?a chuyn gia ?? d?ng u?ng r??u ho?c d?ng s? d?ng ma ty. H??NG D?N CH?M [place] T?I NH   S? d?ng t?t c? thu?c theo ch? d?n c?a chuyn gia ch?m [place] s?c kh?e.  ??t l?ch v tun th? cc cu?c h?n khm l?i theo ch? d?n c?a chuyn gia ch?m [place] s?c kh?e. ?i?u quan tr?ng l tun th? t?t c? cc cu?c h?n. ?I KHM N?U:  Qu v? khng th? dng thu?c theo k ??n.  Cc tri?u ch?ng c?a qu v? khng c?i thi?n ho?c tr? nn t?i t? h?n. NGAY L?P T?C ?I KHM N?U:   Qu v? b? cc tri?u ch?ng c?a c?n ho?ng s? khc v?i cc tri?u ch?ng  thng th??ng c?a qu v?.  Qu v? c suy ngh? nghim tr?ng v? vi?c t? lm t?n th??ng mnh ho?c ng??i khc.  Qu v? ?ang dng thu?c ?i?u tr? c?n ho?ng s? v c tc d?ng ph? nghim tr?ng. ??M B?O QU V?:  Hi?u r cc h??ng d?n ny.  S? theo di tnh tr?ng c?a mnh.  S? yu c?u tr? gip ngay l?p t?c n?u qu v? c?m th?y khng kh?e ho?c th?y tr?m tr?ng h?n. Document Released: 12/30/2004 Document Revised: 01/04/2013 [facility] Patient Information 2015 [place], [place]. This information is not intended to replace advice given to you by your health care provider. Make sure you discuss any questions you have with your health care provider.   Ng?t (Syncope) Ng?t l m?t thu?t ng? y h?c ?? ch? ng?t ho?c b?t t?nh. ?i?u ny c ngh?a l qu v? m?t  th?c v ng xu?ng ??t. Ng??i ta th??ng b?t t?nh trong th?i gian d??i 5 pht. Qu v? c th? c m?t s? l?n co gi?t c? ln ??n 15 giy tr??c khi t?nh v tr? l?i bnh th??ng. Ng?t x?y ra th??ng xuyn h?n ? nh?ng ng??i cao tu?i, nh?ng n c th? x?y ra v?i b?t c? ai. Trong khi h?u h?t nguyn nhn ng?t l khng nguy hi?m, ng?t c th? l d?u hi?u c?a m?t v?n ?? s?c kh?e nghim tr?ng. ?i?u quan tr?ng l c?n ???c ch?m [place] y t?.  NGUYN NHN  Ng?t l do dng mu ln no b? gi?m ??t ng?t. Nguyn nhn c? th? th??ng khng ???c xc ??nh. Cc y?u t? c th? gy ra ng?t bao g?m:  Dng thu?c h? huy?t p.  Thay ??i t? th? ??t ng?t, ch?ng h?n nh? ??ng ln nhanh.  Dng nhi?u thu?c h?n k ??n.  ??ng ? m?t n?i qu lu.  B?nh ??ng kinh.  M?t n??c v ti?p xc qu nhi?u v?i nhi?t.  L??ng ???ng trong  mu th?p (h? ???ng huy?t).  Ph?i r?n m?nh ?? ??i ti?n.  B?nh tim, nh?p tim khng ??u ho?c cc v?n ?? tu?n hon khc.  S? hi, ?au kh? v? tnh th?n, nhn th?y mu ho?c ?au r?t nhi?u. TRI?U CH?NG  Ngay tr??c khi ng?t, qu v? c th?:  C?m th?y chng m?t ho?c chong vng.  C?m th?y bu?n nn.  Nhn th?y ton mu tr?ng ho?c ton mu ?en trong t?m nhn c?a qu v?.  Da l?nh,  ?m. CH?N ?ON  Chuyn gia ch?m [place] s?c kh?e s? h?i v? cc tri?u ch?ng, khm th?c th?, v lm ?i?n tm ?? (ECG) ?? ghi l?i ho?t ??ng ?i?n c?a tim qu v?. Chuyn gia ch?m [place] s?c kh?e c?ng c th? khm tim ho?c lm cc xt nghi?m mu khc ?? xc ??nh nguyn nhn gy ng?t, c th? bao g?m:  Siu m tim qua thnh ng?c (TTE). Trong siu m tim, sng m thanh ???c s? d?ng ?? ?nh gi mu l?u thng qua tim nh? th? no.  Siu m tim qua th?c qu?n (TEE).  Theo di tim. Bi?n php ny cho php chuyn gia ch?m [place] s?c kh?e theo di nh?p v nh?p ?i?u c?a tim theo th?i gian th?c.  My ?i?n tim. ?y l m?t thi?t b? c?m tay ghi l?i nh?p tim v c th? gip ch?n ?on r?i lo?n nh?p tim. N cho php chuyn gia ch?m [place] s?c kh?e theo di ho?t ??ng c?a tim trong vi ngy n?u c?n.  Nghi?m php g?ng s?c b?ng cch t?p th? d?c ho?c b?ng cch s? d?ng thu?c lm cho tim ??p nhanh h?n. ?I?U TR?  H?u h?t cc tr??ng h?p khng c?n ?i?u tr?Marland Kitchen Ty thu?c vo nguyn nhn gy ng?t, chuyn gia ch?m [place] s?c kh?e c th? [place] qu v? thay ??i ho?c d?ng m?t s? cc lo?i thu?c c?a qu v?. H??NG D?N CH?M [place] T?I NH  Nh? ai ? ? l?i cng qu v? cho ??n khi qu v? c?m th?y ?n ??nh.  Khng li xe, s? d?ng my mc, ho?c ch?i th? thao cho ??n khi chuyn gia ch?m [place] s?c kh?e ni c th? th?c hi?n ???c ?i?u ?.  Tun th? m?i cu?c h?n khm l?i theo ch? d?n c?a chuyn gia ch?m  s?c kh?e.  N?m xu?ng ngay l?p t?c n?u qu v? b?t ??u c?m th?y nh? qu v? c th? ng?t. Ht th? su v ??u. Ch? cho ??n khi t?t c? cc tri?u ch?ng qua ?i.  U?ng ?? n??c ?? gi? cho n??c ti?u trong ho?c vng nh?t.  N?u qu v? ?ang dng thu?c ?i?u tr? huy?t p ho?c thu?c ?i?u tr? b?nh tim, hy ??ng d?y t? t? v dnh vi pht ?? ng?i r?i sau ? ??ng ln. ?i?u ny c th? gi?m c?m gic chng m?t. NGAY L?P T?C ?I KHM N?U:   Qu v? b? ?au ??u nhi?u.  Qu v? b? ?au b?t th??ng ? ng?c, b?ng ho?c l?ng.  Qu v? b? ch?y mu t? mi?ng ho?c tr?c trng, ho?c ?i ngoi ra phn c  mu ?en ho?c mu h?c n.  Qu v? c nh?p tim khng ??u ho?c r?t nhanh.  Qu v? b? ?au khi th?.  Qu v? b? ng?t l?p ?i l?p l?i ho?c co gi?t gi?ng nh? ??ng kinh trong lc ng?t.  Qu v? ng?t khi ng?i ho?c n?m xu?ng.  Qu v? b? l l?n.  Qu v? ?i b? kh kh?n.  Qu v? b? y?u nhi?u.  Ladell Heads  v? c v?n ?? v? th? l?c. N?u qu v? ng?t, hy g?i cc d?ch v? c?p c?u ? ??a ph??ng c?a qu v? (911 ? Hoa K?). Khng t? li xe ??n b?nh vi?n.  ??M B?O QU V?:  Hi?u r cc h??ng d?n ny.  S? theo di tnh tr?ng c?a mnh.  S? yu c?u tr? gip ngay l?p t?c n?u qu v? c?m th?y khng kh?e ho?c th?y tr?m tr?ng h?n. Document Released: 07/01/2011 Document Revised: 01/04/2013 [facility] Patient Information 2015 [place], [place]. This information is not intended to replace advice given to you by your health care provider. Make sure you discuss any questions you have with your health care provider.   Emergency Department Resource Guide 1) Find a Doctor and Pay Out of Pocket Although you won't have to find out who is covered by your insurance plan, it is a good idea to ask around and get recommendations. You will then need to call the office and see if the doctor you have chosen will accept you as a new patient and what types of options they offer for patients who are self-pay. Some doctors offer discounts or will set up payment plans for their patients who do not have insurance, but you will need to ask so you aren't surprised when you get to your appointment.  2) Contact Your Local Health Department Not all health departments have doctors that can see patients for sick visits, but many do, so it is worth a call to see if yours does. If you don't know where your local health department is, you can check in your phone book. The CDC also has a tool to help you locate your state's health department, and many state websites also have listings of all of their local health departments.  3) Find a Walk-in Clinic If your  illness is not likely to be very severe or complicated, you may want to try a walk in clinic. These are popping up all over the country in pharmacies, drugstores, and shopping centers. They're usually staffed by nurse practitioners or physician assistants that have been trained to treat common illnesses and complaints. They're usually fairly quick and inexpensive. However, if you have serious medical issues or chronic medical problems, these are probably not your best option.  No Primary Care Doctor: - Call Health Connect at  [phone] - they can help you locate a primary care doctor that  accepts your insurance, provides certain services, etc. - Physician Referral Service- [phone]  Chronic Pain Problems: Organization         Address  Phone   Notes  [facility]  [phone] Patients need to be referred by their primary care doctor.   Medication Assistance: Organization         Address  Phone   Notes  [facility] Medication [facility] [address] [place], [place] [address] [phone] --Must be a resident of [facility] -- Must have NO insurance coverage whatsoever (no Medicaid/ Medicare, etc.) -- The pt. MUST have a primary care doctor that directs their care regularly and follows them in the community   [facility]ist  [phone]   [employer]  [phone]    Agencies that provide inexpensive medical care: Organization         Address  Phone   Notes  [facility]  [phone]   [facility]    [phone]   [facility]  [address] [place], [place] [address] [phone]   [facility] [address], [place] [phone]   [facility]anned Parenthood    [phone]   [facility]    [phone]   [facility]  [address], [place] Phone:  [phone], Fax:  [phone] Hours of Operation:   9 am - 6 pm, M-F.  Also accepts Medicaid/Medicare and self-pay.  [facility]  [address], [place] Phone: [phone], Fax: [phone]. Hours of Operation:  8:30 am - 5:30 pm, M-F.  Also accepts Medicaid and self-pay.  [facility] [place] [address] [place] Phone: [phone]   [facility] [address] Natasha Bence [place], [place] [phone], Ext. [phone] Mondays & Thursdays: 7-9 AM.  First 15 patients are seen on a first come, first serve basis.    Medicaid-accepting [facility] Providers:  Organization         Address  Phone   Notes  [facility] [address], [place] [phone] Also accepts self-pay patients.  [facility] [address] Laurell Josephs [place], [place]  [phone]   [facility] [address], [place] [phone]   [facility] [address] [place] [phone]   Renaye Rakers [address], [place]   [phone] Only accepts [place] Access [place] patients after they have their name applied to their card.   Self-Pay (no insurance) in [facility]:  Organization         Address  Phone   Notes  Sickle Cell Patients,  [facility] [address] [place], [place] [phone]   [facility] Urgent Care [address] [place], [place] [phone]   [facility] [place]  [address], [place] [phone]   [facility]/Dr. Osei-Bonsu  [address] [place] or [address], [place] [phone] Phone number for both [place] and [place] locations is the same.  [facility] [address], [place] [phone]   [facility] [address] [place] or [address] [phone]   [facility] [address] [place] [phone], phone; [phone], fax Sees patients 1st and 3rd Saturday of every month.  Must not qualify for public or private insurance (i.e. Medicaid, Medicare, [place] Health Choice, Veterans' Benefits)  Household income should be no more than 200% of the poverty level The clinic cannot treat you if you are pregnant or think you are pregnant  Sexually transmitted diseases are not treated at the clinic.    Dental Care: Organization         Address  Phone  Notes  [facility] Department of [facility] [address] [place], [place] [phone] Accepts children up to age 53 who are enrolled in [place] or [place] Health Choice; pregnant women with a Medicaid card; and children who have applied for Medicaid or [place] Health Choice, but were declined, whose parents can pay a reduced fee at time of service.  [facility] Department of [facility]  [address], [place] [phone] Accepts children up to age 42 who are enrolled in [place]  or [place] Health Choice; pregnant women with a Medicaid card; and children who have applied for Medicaid or [place] Health Choice, but were declined, whose parents can pay a reduced fee at time of service.  [facility]  [address] [place], [place] [phone] Patients are seen by appointment only. Walk-ins are not accepted. [facility] will see patients 37 years of age and older. Monday - Tuesday (8am-5pm) Most Wednesdays (8:30-5pm) $30 per visit, cash only  [facility] Adult [facility] Access [facility]  [address], [facility] [phone] Patients are seen by appointment only. Walk-ins are not accepted. [facility] will see patients 4 years of age and older. One Wednesday Evening (Monthly: Volunteer Based).  $30 per visit, cash only  [employer]  [phone] for adults; Children under age 67, call Graduate Pediatric Dentistry at  [phone]. Children aged 98-14, please call [phone] to request a pediatric application.  Dental services are provided in all areas of dental care including fillings, crowns and bridges, complete and partial dentures, implants, gum treatment, root canals, and extractions. Preventive care is also provided. Treatment is provided to both adults and children. Patients are selected via a lottery and there is often a waiting list.   [facility] [address], [place]  [phone] [email or web address]   [facility] [address] [place], [place] [phone], Ext. [phone] Second and Fourth Thursday of each month, opens at 6:30 AM; Clinic ends at 9 AM.  Patients are seen on a first-come first-served basis, and a limited number are seen during each clinic.   [facility]  [address] Ether Griffins [place], [place] [phone]   Eligibility Requirements You must have lived in [place], [place], or [place] counties for at least the last three months.   You cannot be eligible for state or federal sponsored [place], including [employer], [place], or [employer].   You generally cannot be eligible for healthcare insurance through your employer.    How to apply: Eligibility screenings are held every Tuesday and Wednesday afternoon from 1:00 pm until 4:00 pm. You do not need an appointment for the interview!  [facility] [address], [place], [place] [phone]   [facility] Health Department  [phone]   [facility] Health Department  [phone]   [facility] Health Department  [phone]    Behavioral Health Resources in the Community: Intensive Outpatient Programs Organization         Address  Phone  Notes  [facility] Services [address], [place], [place] [phone]   [facility] Outpatient [address] [place], [place] [phone]   [facility]: Alcohol [facility]  Drug Sv[facility] [address], [place], [place]  [phone]   [facility] Mental Health [address],  [place], [place] [phone] or [phone]   Substance Abuse Resources Organization         Address  Phone  Notes  [facility]  [phone]   [facility]  [phone]   [facility]  [phone]   Floydene Flock  [phone]   Residential & Outpatient Substance Abuse Program  [phone]   Psychological Services Organization         Address  Phone  Notes  [facility] Behavioral Health  [phone]   [facility] Services  [phone]   [facility] Mental Health [address], [place] [phone] or [phone]  Mobile Crisis Teams Organization         Address  Phone  Notes  [facility], Mobile Crisis Care Unit  [phone]   [facility]  [address] [place], [place] [phone]   Doristine Locks [address] [place] [phone]    Self-Help/Support Groups Organization         Address  Phone             Notes  [facility] [place] - variety of support groups  [phone] Call for more information  Narcotics Anonymous (NA), [facility] [address], [employer] [place]  2 meetings at this location   Statistician         Address  Phone  Notes  [facility] [address],    [place]  [phone]   [facility]  [address], [place] [address], [place], [place] [phone]   [facility] Treatment Facility [address] [place], [place] [phone] Admissions: 8am-3pm M-F  [facility] [address]    [place], [place] [phone]   [facility] [address] [place], [place], [place] [phone]   [facility] [address],  [place], [place] [phone]   [facility] - Intensive Outpatient [address]ure[address] Joseph[address], [place], [place]  [phone]   [facility] ([facility] [address] [place].,  [place], [place] [phone] or [phone]   [facility] ([facility]) [address], [place], [place] [phone] Accepts Medicaid  [facility] [address],  [place] [phone] Substance Abuse/Addiction Treatment   [facility] Organization         Address  Phone  Notes  [facility]  [phone]   Angie Fava, PhD [address] Ervin Knack [place], [place]   [phone] or [phone]   [facility] Behavioral   [address] [place], [place] [phone]   [facility] [address] [place], [place] [phone] Insurance/Medicaid/sponsorship through [facility] and Families [address]                                    [place], [place] [phone] Therapy/tele-psych/case  [facility] [address], [place] [phone]    Dr. Lolly Mustache  [phone]   [facility]  [employer] [facility] Dept. 1) [address] [place] 2) [address], [place] 3)  [address], [place] [phone]  [phone]   [facility] Child Abuse Hotline [phone] or [phone] (After Hours)

## 2014-08-06 IMAGING — CT CT ABD-PELV W/ CM
1 of 3 series · 14 of 32 positions shown, 19 images · IV contrast (omnipaque)
Comparison: CT abdomen and pelvis 06/17/2008.

CLINICAL DATA: Abdominal pain radiating into the back for 2-3 days.

CT ABDOMEN AND PELVIS WITH CONTRAST
TECHNIQUE: Multidetector CT imaging of the abdomen and pelvis was
performed following the standard protocol during bolus
administration of intravenous contrast.
Contrast: 100mL OMNIPAQUE IOHEXOL 300 MG/ML  SOLN

[Series 2: abd/pel with · axial · 0.74mm/px · z∈[-674,-254]mm · 14 of 94 slices shown, 19 images]
[im 5/94  soft-tissue]
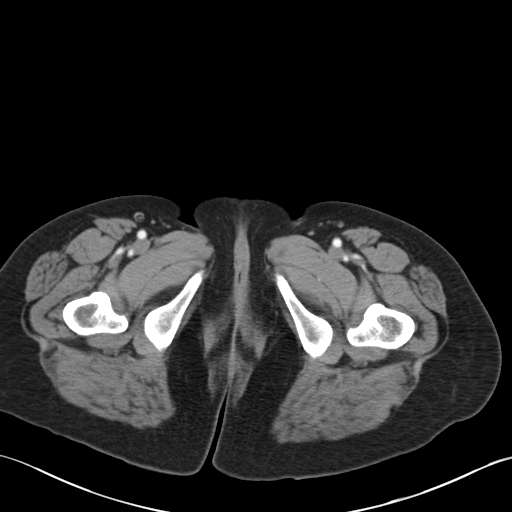
[im 5/94  bone]
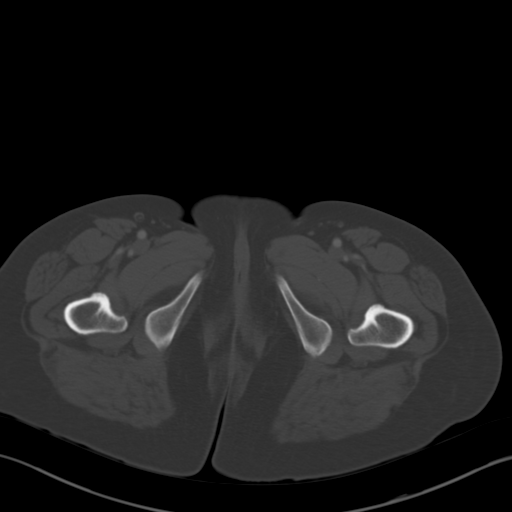
[im 15/94  soft-tissue]
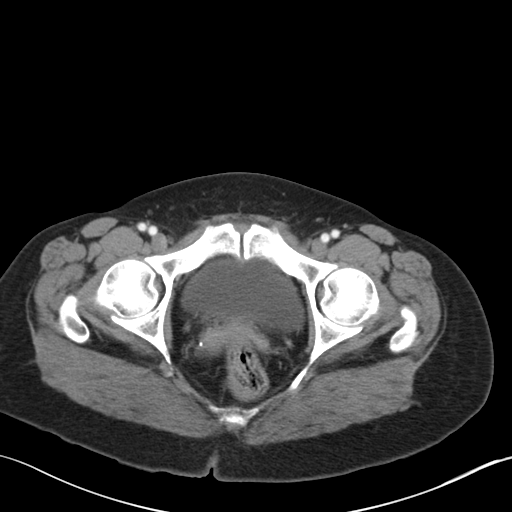
[im 20/94  soft-tissue]
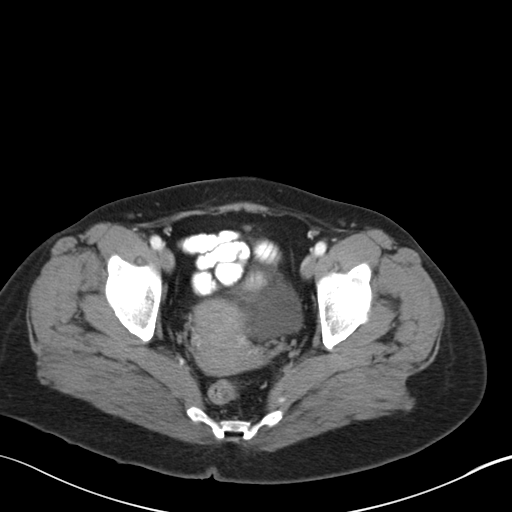
[im 25/94  soft-tissue]
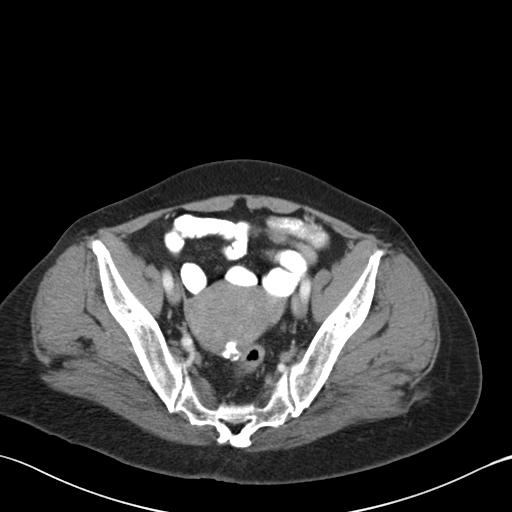
[im 35/94  soft-tissue]
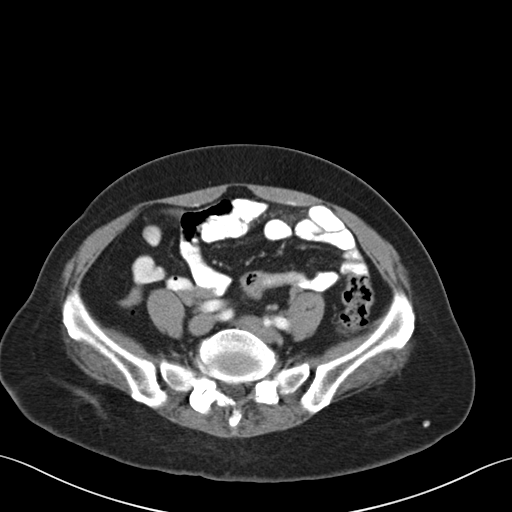
[im 40/94  soft-tissue]
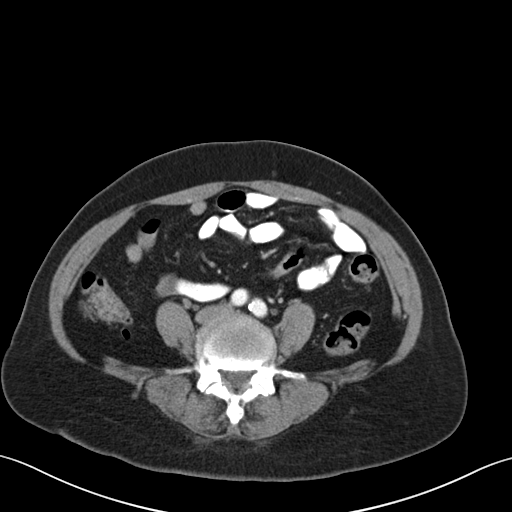
[im 49/94  soft-tissue]
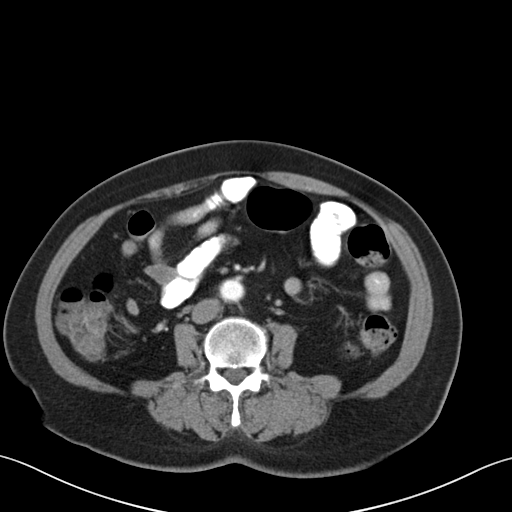
[im 54/94  soft-tissue]
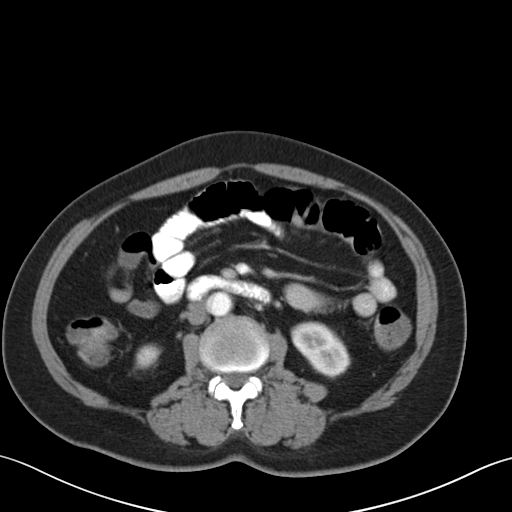
[im 59/94  soft-tissue]
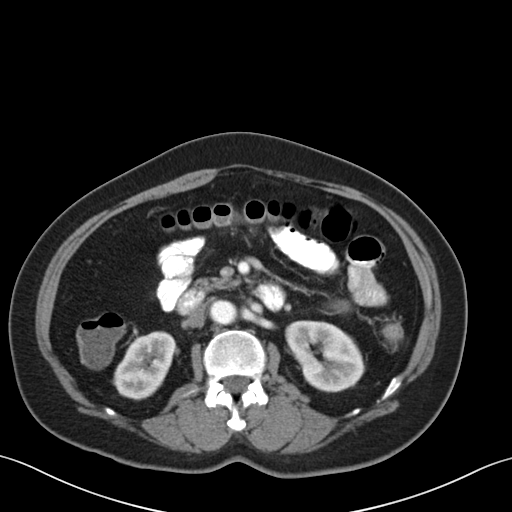
[im 59/94  bone]
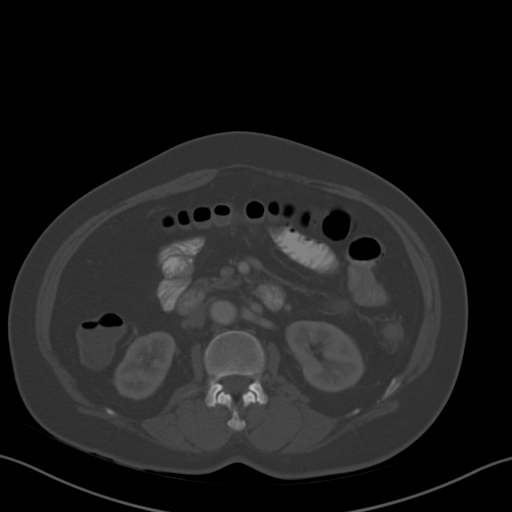
[im 69/94  soft-tissue]
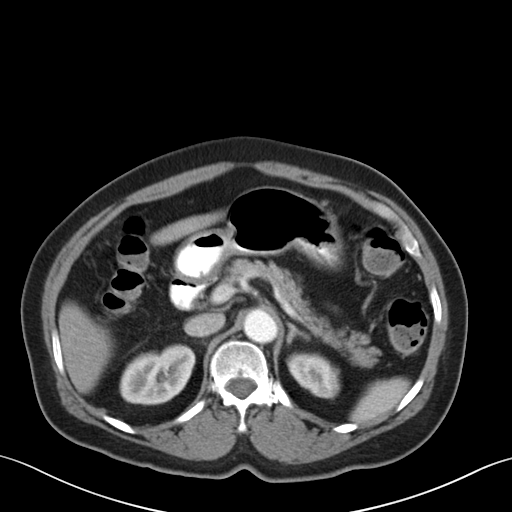
[im 74/94  soft-tissue]
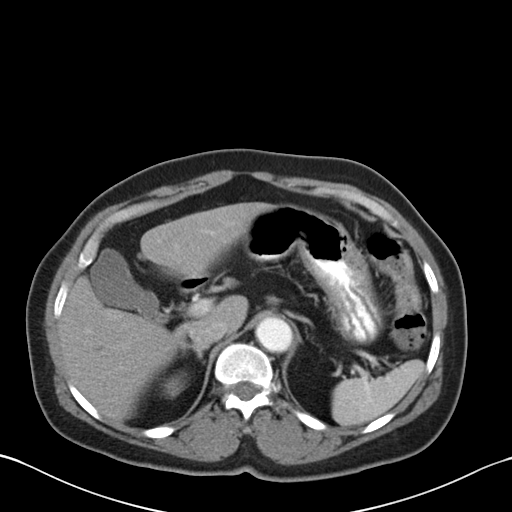
[im 74/94  lung]
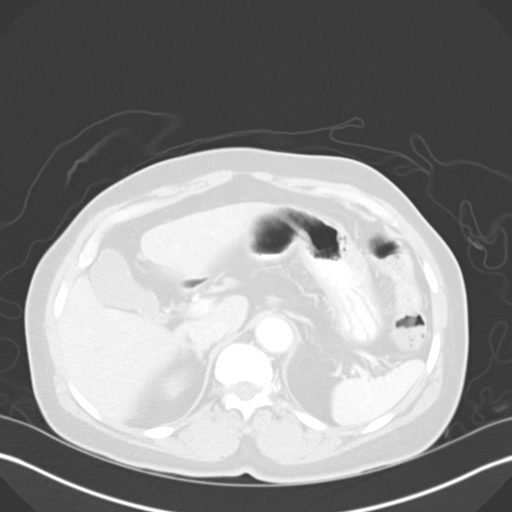
[im 79/94  soft-tissue]
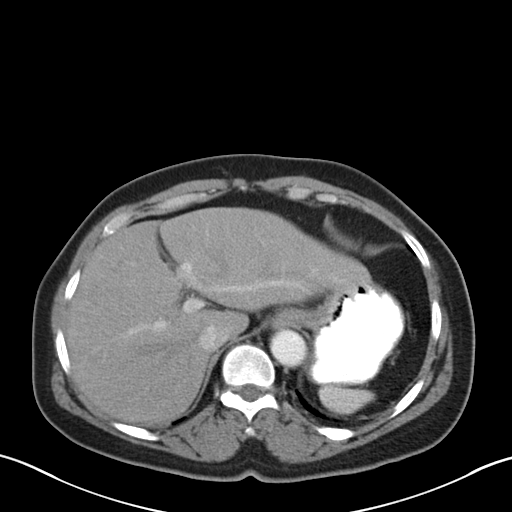
[im 79/94  lung]
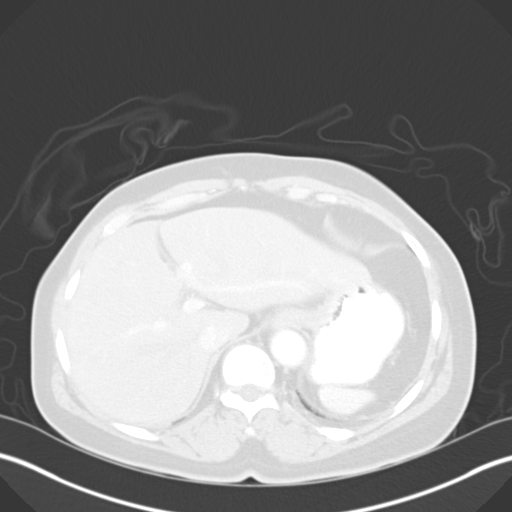
[im 84/94  lung]
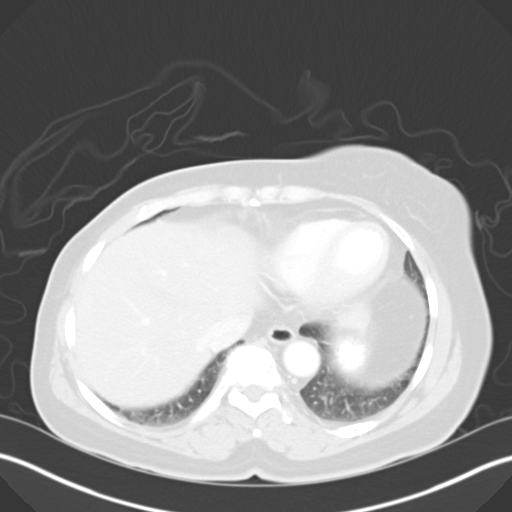
[im 89/94  soft-tissue]
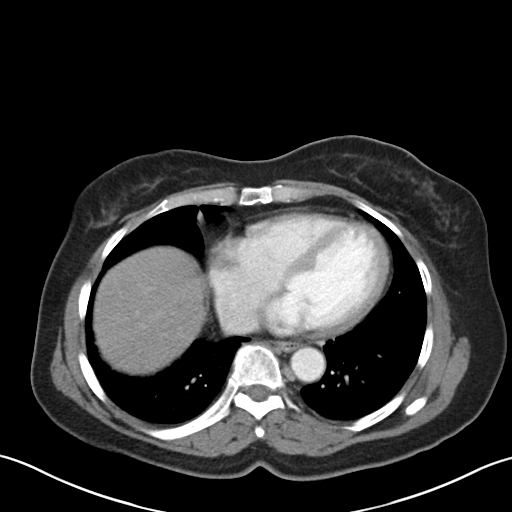
[im 89/94  lung]
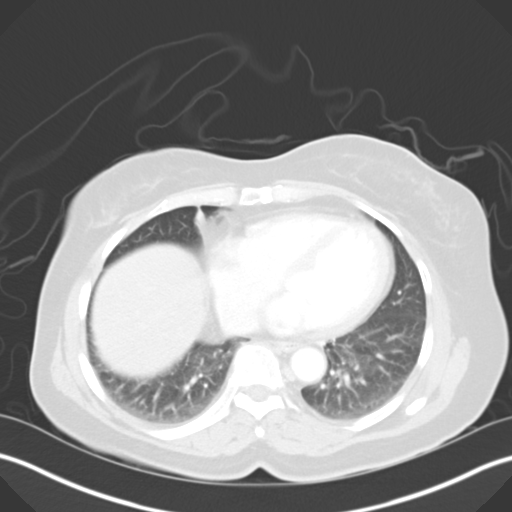

[14 of 32 positions shown; findings below may reference images not displayed]

FINDINGS: There is some dependent atelectasis in lung bases.  No
pleural or pericardial effusion.  Heart size is upper normal.

The liver, gallbladder, spleen, adrenal glands, pancreas and
kidneys appear normal.  Small calcified uterine fibroid is noted.
Adnexa are unremarkable.  Urinary bladder appears normal.  The
stomach, small and large bowel and appendix appear normal.
Injection granulomata in the buttocks are unchanged.  No focal bony
abnormality is identified.
IMPRESSION: 1.  No acute finding or finding to explain the patient's symptoms.
2.  No change in a small calcified uterine fibroid.

## 2015-04-03 ENCOUNTER — Encounter: Payer: Self-pay | Admitting: Internal Medicine

## 2015-07-10 ENCOUNTER — Ambulatory Visit: Payer: Medicaid Other | Attending: Family Medicine | Admitting: Physical Therapy

## 2015-07-10 DIAGNOSIS — M25551 Pain in right hip: Secondary | ICD-10-CM | POA: Insufficient documentation

## 2015-07-10 DIAGNOSIS — M6281 Muscle weakness (generalized): Secondary | ICD-10-CM | POA: Diagnosis present

## 2015-07-10 DIAGNOSIS — M5441 Lumbago with sciatica, right side: Secondary | ICD-10-CM | POA: Diagnosis present

## 2015-07-10 DIAGNOSIS — M5442 Lumbago with sciatica, left side: Secondary | ICD-10-CM | POA: Insufficient documentation

## 2015-07-10 NOTE — Therapy (Signed)
[facility] [address] [place], [place], [address] Phone: [phone]   Fax:  [phone]  Physical Therapy Evaluation  Patient Details  Name: Renee Mccann MRN: [id number] Date of Birth: October 15, 1970 Referring Provider: Marcell Barlow  Encounter Date: 07/10/2015      PT End of Session - 07/10/15 1231    Visit Number 1   Number of Visits 1   Authorization Type Medicaid   PT Start Time 1110   PT Stop Time 1200   PT Time Calculation (min) 50 min   Activity Tolerance Patient limited by pain   Behavior During Therapy [facility] for tasks assessed/performed      Past Medical History  Diagnosis Date  . Hypertension   . Pyelonephritis   . Hypokalemia     No past surgical history on file.  There were no vitals filed for this visit.       Subjective Assessment - 07/10/15 1113    Subjective Pt arriving with her neighbor and interpreter. Pt c/o pain in her R hip which began to worsen in February. Pt also c/o bilateral low back pain. Pt was working in a [employer] up until February.    Patient is accompained by: Interpreter   Limitations House hold activities;Sitting;Lifting;Standing;Walking   How long can you sit comfortably? 5   How long can you stand comfortably? 5   How long can you walk comfortably? 5   Diagnostic tests May 24, 2015   Patient Stated Goals Stop hurting   Currently in Pain? Yes   Pain Score 8    Pain Orientation Right   Pain Descriptors / Indicators Dull;Aching;Sharp   Pain Onset More than a month ago   Pain Frequency Constant   Aggravating Factors  walking, standing, ADL's   Pain Relieving Factors bending, meds   Effect of Pain on Daily Activities Unable to work and sometime the pain causes her to lose her balance.      Pt reported that she has an appointment for a Cortisone injection for her lumbar spine scheduled on 07/19/15.        [facility] PT Assessment - 07/10/15 0001    Assessment   Medical  Diagnosis back pain   Referring Provider Dominic Kinley   Onset Date/Surgical Date 02/14/15   Hand Dominance Right   Next MD Visit 07/19/15   Prior Therapy no   Balance Screen   Has the patient fallen in the past 6 months Yes   How many times? 1  also reporting knee buckeling at times without fall   Has the patient had a decrease in activity level because of a fear of falling?  Yes   Is the patient reluctant to leave their home because of a fear of falling?  No   Home Environment   Living Environment Private residence   Living Arrangements Children   Available Help at Discharge Family;Friend(s)   Type of Home House   Home Access Stairs to enter   Entrance Stairs-Number of Steps 4   Entrance Stairs-Rails None   Home Layout One level   Home Equipment None   Prior Function   Level of Independence Independent   Vocation Part time employment   Vocation Requirements [employer]   Leisure Gardening   Cognition   Overall Cognitive Status Within Functional Limits for tasks assessed   Functional Tests   Functional tests Sit to Stand   Sit to Stand   Comments Pt performed sit to stand  with guarding using bilateral UE for push off.    Posture/Postural Control   Posture/Postural Control Postural limitations   Postural Limitations Rounded Shoulders;Forward head;Weight shift left   Posture Comments Pain created weight shift to left side   ROM / Strength   AROM / PROM / Strength AROM;Strength   AROM   AROM Assessment Site --   Right/Left Hip --   Strength   Overall Strength Comments All MMT to LE's created increased pain R>L   Strength Assessment Site Hip;Knee;Ankle   Right/Left Hip Right;Left   Right Hip Flexion 2/5   Right Hip Extension 2/5   Right Hip ABduction 2/5   Right Hip ADduction 2/5   Left Hip Flexion 3+/5   Left Hip Extension 3/5   Left Hip ABduction 3+/5   Left Hip ADduction 3/5   Right/Left Knee Right;Left   Right Knee Flexion 3/5   Right Knee Extension 3/5   Left  Knee Flexion 4/5   Left Knee Extension 4/5   Right/Left Ankle Right;Left   Right Ankle Dorsiflexion 3+/5   Right Ankle Plantar Flexion 4/5   Palpation   Spinal mobility unable to assess limited by pain   Palpation comment Pt with tenderness to palpation over L3-S1 and R piriformis, pt with C-sign pain presentation   Bed Mobility   Bed Mobility Supine to Sit;Sit to Supine;Sit to Sidelying Right   Supine to Sit 7: Independent   Sit to Sidelying Right 4: Min guard   Sit to Sidelying Right Details (indicate cue type and reason) Pt limited by increased pain in R hip                   [facility] Adult PT Treatment/Exercise - 07/10/15 0001    Exercises   Exercises Other Exercises   Other Exercises  Pt unable to tolerate sitting, prong and standing. Pt edu in supine STC, supine lying with hips and knees at 90 degrees, and trunk rotation                PT Education - 07/10/15 1222    Education provided Yes   Education Details Pt edu in HEP: supine flexion with knee and hips at 90 degrees, supine trunk rotation bilaterally, and single knee to chest   Person(s) Educated Patient;Other (comment)   Methods Explanation;Demonstration;Handout;Verbal cues;Tactile cues   Comprehension Verbalized understanding;Returned demonstration;Verbal cues required;Tactile cues required                    Plan - 07/10/15 1232    Clinical Impression Statement Pt arriving to therapy with her friend/caregiver and intepreter. Pt reporting her year of birth is different, but does not have legal documentation to correct it. Pt reporting 8-9/10 pain in her right low back and R hip. Pt with C-sign and tenderness to palpation over L3-S1.     PT Frequency One time visit  Due to financial reasons   Consulted and Agree with Plan of Care Patient      Patient will benefit from skilled therapeutic intervention in order to improve the following deficits and impairments:     Visit  Diagnosis: Bilateral low back pain with sciatica, sciatica laterality unspecified  Muscle weakness (generalized)  Pain in right hip     Problem List Patient Active Problem List   Diagnosis Date Noted  . S/P appendectomy 02/08/2013  . HTN (hypertension) 02/08/2013    Sharmon LeydenJennifer R Martin MPT 07/10/2015, 1:06 PM  [facility] [phone]  [address] [place], [place], [address] Phone: [phone]   Fax:  [phone]  Name: Birder RobsonQuyen L Catalina MRN: [id number] Date of Birth: Apr 07, 1970

## 2015-12-03 ENCOUNTER — Emergency Department (HOSPITAL_COMMUNITY)
Admission: EM | Admit: 2015-12-03 | Discharge: 2015-12-03 | Disposition: A | Discharge status: Home / Self Care | Payer: Medicaid Other | Attending: Emergency Medicine | Admitting: Emergency Medicine

## 2015-12-03 ENCOUNTER — Emergency Department (HOSPITAL_COMMUNITY): Payer: Medicaid Other

## 2015-12-03 ENCOUNTER — Encounter (HOSPITAL_COMMUNITY): Payer: Self-pay | Admitting: Emergency Medicine

## 2015-12-03 DIAGNOSIS — R55 Syncope and collapse: Secondary | ICD-10-CM | POA: Diagnosis not present

## 2015-12-03 DIAGNOSIS — E119 Type 2 diabetes mellitus without complications: Secondary | ICD-10-CM | POA: Diagnosis not present

## 2015-12-03 DIAGNOSIS — M545 Low back pain, unspecified: Secondary | ICD-10-CM

## 2015-12-03 DIAGNOSIS — F17210 Nicotine dependence, cigarettes, uncomplicated: Secondary | ICD-10-CM | POA: Diagnosis not present

## 2015-12-03 DIAGNOSIS — Y999 Unspecified external cause status: Secondary | ICD-10-CM | POA: Insufficient documentation

## 2015-12-03 DIAGNOSIS — I10 Essential (primary) hypertension: Secondary | ICD-10-CM | POA: Insufficient documentation

## 2015-12-03 DIAGNOSIS — Y939 Activity, unspecified: Secondary | ICD-10-CM | POA: Diagnosis not present

## 2015-12-03 DIAGNOSIS — W1839XA Other fall on same level, initial encounter: Secondary | ICD-10-CM | POA: Insufficient documentation

## 2015-12-03 DIAGNOSIS — Y9289 Other specified places as the place of occurrence of the external cause: Secondary | ICD-10-CM | POA: Insufficient documentation

## 2015-12-03 DIAGNOSIS — Z79899 Other long term (current) drug therapy: Secondary | ICD-10-CM | POA: Diagnosis not present

## 2015-12-03 HISTORY — DX: Type 2 diabetes mellitus without complications: E11.9

## 2015-12-03 HISTORY — DX: Unspecified osteoarthritis, unspecified site: M19.90

## 2015-12-03 HISTORY — DX: Other chronic pain: G89.29

## 2015-12-03 HISTORY — DX: Dorsalgia, unspecified: M54.9

## 2015-12-03 LAB — URINALYSIS, ROUTINE W REFLEX MICROSCOPIC
GLUCOSE, UA: NEGATIVE mg/dL
Hgb urine dipstick: NEGATIVE
Ketones, ur: NEGATIVE mg/dL
Nitrite: NEGATIVE
PH: 5.5 (ref 5.0–8.0)
Protein, ur: NEGATIVE mg/dL
SPECIFIC GRAVITY, URINE: 1.026 (ref 1.005–1.030)

## 2015-12-03 LAB — CBC
HCT: 42.8 % (ref 36.0–46.0)
HEMOGLOBIN: 14.5 g/dL (ref 12.0–15.0)
MCH: 27.4 pg (ref 26.0–34.0)
MCHC: 33.9 g/dL (ref 30.0–36.0)
MCV: 80.9 fL (ref 78.0–100.0)
Platelets: 270 10*3/uL (ref 150–400)
RBC: 5.29 MIL/uL — ABNORMAL HIGH (ref 3.87–5.11)
RDW: 13.3 % (ref 11.5–15.5)
WBC: 8 10*3/uL (ref 4.0–10.5)

## 2015-12-03 LAB — BASIC METABOLIC PANEL
ANION GAP: 8 (ref 5–15)
BUN: 17 mg/dL (ref 6–20)
CHLORIDE: 110 mmol/L (ref 101–111)
CO2: 24 mmol/L (ref 22–32)
Calcium: 9.6 mg/dL (ref 8.9–10.3)
Creatinine, Ser: 0.85 mg/dL (ref 0.44–1.00)
GFR calc Af Amer: >60 mL/min (ref >60)
GLUCOSE: 97 mg/dL (ref 65–99)
Potassium: 3.3 mmol/L — ABNORMAL LOW (ref 3.5–5.1)
Sodium: 142 mmol/L (ref 135–145)

## 2015-12-03 LAB — CBG MONITORING, ED: Glucose-Capillary: 112 mg/dL — ABNORMAL HIGH (ref 65–99)

## 2015-12-03 LAB — URINE MICROSCOPIC-ADD ON
BACTERIA UA: NONE SEEN
RBC / HPF: NONE SEEN RBC/hpf (ref 0–5)

## 2015-12-03 MED ORDER — HYDROCODONE-ACETAMINOPHEN 5-325 MG PO TABS
1.0000 | ORAL_TABLET | Freq: Once | ORAL | Status: AC
  Administered 2015-12-03: 1 via ORAL
  Filled 2015-12-03: qty 1

## 2015-12-03 NOTE — ED Notes (Signed)
With interpreter, pt reports problems began at 0200 on Saturday after sudden LOC in bathroom, pt heard voice of her children's father calling her, then she woke up. Pt was lying on floor for a long time, tried to sit up by herself but was nauseated, vomited, crawled to bedroom, got into bed, felt confused, was diaphoretic. Has been feeling tired and palpitations since event. Larey SeatFell 2 more times this week, last fall 3 days ago, caused pain to right hand. Reports feeling alternating hot and cold since then. Increased anxiety recently due to feeling unwell. Left sided chest pain relieved with applying manual pressure to chest x 3 years, has been constant for 3 years, has been intermittent now. Reports 3-4 episodes of chest pain and SOB today.

## 2015-12-03 NOTE — ED Notes (Signed)
Pt speaking to PA via interpreter at this time. Will obtain blood when PA is finished with patient interview.

## 2015-12-03 NOTE — ED Triage Notes (Signed)
Pt comes with complaints of multiple falls over the past few months.  Reports bursitis in her hips and arthritis in her back. Pt reports falling in the bathroom and waking up an "hour or so later".  Neighbor brought her in today and states she has a 45 year old daughter that is supposed to be living with her but she is not sure that is the case.

## 2015-12-03 NOTE — ED Provider Notes (Signed)
[facility]-EMERGENCY DEPT Provider Note   CSN: [id number] Arrival date & time: 12/03/15  1648     History   Chief Complaint Chief Complaint  Patient presents with  . Multiple Falls  . Loss of Consciousness    HPI Renee Mccann is a 45 y.o. female who presents with frequent falls and syncopal episode. History is limited due to language barrier. She is accompanied by her neighbor who has been helping her over the past year. PMH significant for anxiety, DM, chronic back and hip pain. She states that 3 days ago she got up in the middle of the night to go to the bathroom. She sat on the toilet and went to the bathroom and lost consciousness. She denies prodromal symptoms. She found herself on the floor and was unable to get up. She crawled to the bed and was able to get in to the bed. Since then she has felt tired, had palpitations and intermittent chest pain, SOB, nausea, vomiting. She reports frequent panic attacks and anxiety due to family problems. She also has had multiple falls over the past several months. She will just be walking and her legs will give out and she will fall but not lose consciousness. She states she had a fall last week and hurt her right wrist. She has seen orthopedics for this and they tried steroid injections which did not help. They also wanted her to go to PT however she could not afford this. Denies fever, chills, headache, vision changes, abdominal pain, dysuria. Also of note that her age is actually [age] since her papers in [place] listed her age incorrectly.  HPI  Past Medical History:  Diagnosis Date  . Arthritis   . Chronic back pain   . Diabetes mellitus without complication (HCC)    Type II  . Hypertension   . Hypokalemia   . Pyelonephritis     Patient Active Problem List   Diagnosis Date Noted  . S/P appendectomy 02/08/2013  . HTN (hypertension) 02/08/2013    History reviewed. No pertinent surgical history.  OB History    No data available        Home Medications    Prior to Admission medications   Medication Sig Start Date End Date Taking? Authorizing Provider  lisinopril-hydrochlorothiazide (PRINZIDE,ZESTORETIC) 20-25 MG per tablet Take 1 tablet by mouth daily.    Historical Provider, MD  loratadine (CLARITIN) 10 MG tablet Take 10 mg by mouth daily.    Historical Provider, MD  traMADol (ULTRAM) 50 MG tablet Take 1 tablet (50 mg total) by mouth every 6 (six) hours as needed. Patient taking differently: Take 50 mg by mouth every 6 (six) hours as needed for moderate pain.  05/14/13   Cathren LaineKevin Steinl, MD    Family History No family history on file.  Social History Social History  Substance Use Topics  . Smoking status: Current Some Day Smoker    Types: Cigarettes  . Smokeless tobacco: Never Used  . Alcohol use No     Allergies   Other   Review of Systems Review of Systems  Constitutional: Negative for chills and fever.  Eyes: Negative for visual disturbance.  Respiratory: Positive for shortness of breath.   Cardiovascular: Positive for chest pain.  Gastrointestinal: Positive for nausea and vomiting. Negative for abdominal pain.  Genitourinary: Negative for dysuria.  Neurological: Positive for syncope and weakness. Negative for light-headedness, numbness and headaches.  Psychiatric/Behavioral: The patient is nervous/anxious.   All other systems reviewed and are negative.  Physical Exam Updated Vital Signs BP 127/90 (BP Location: Right Arm)   Pulse 78   Temp 98.3 F (36.8 C) (Oral)   Resp 20   Ht 5\' 3"  (1.6 m)   Wt 69.4 kg   SpO2 98%   BMI 27.10 kg/m   Physical Exam  Constitutional: She is oriented to person, place, and time. She appears well-developed and well-nourished. No distress.  HENT:  Head: Normocephalic and atraumatic.  Eyes: Conjunctivae are normal. Pupils are equal, round, and reactive to light. Right eye exhibits no discharge. Left eye exhibits no discharge. No scleral icterus.  Neck:  Normal range of motion.  Cardiovascular: Normal rate and regular rhythm.  Exam reveals no gallop and no friction rub.   No murmur heard. Pulmonary/Chest: Effort normal and breath sounds normal. No respiratory distress. She has no wheezes. She has no rales. She exhibits no tenderness.  Abdominal: Soft. Bowel sounds are normal. She exhibits no distension and no mass. There is no tenderness. There is no rebound and no guarding. No hernia.  Musculoskeletal:  Back: Inspection: No masses, deformity, or rash Palpation: Lumbar midline spinal tenderness. No paraspinal muscle tenderness. Strength: 5/5 in lower extremities and normal plantar and dorsiflexion Sensation: Intact sensation with light touch in lower extremities bilaterally Gait: Normal gait Reflexes: Patellar reflex is 2+ bilaterally, Achilles is 2+ bilaterally SLR: Negative seated straight leg raise   Neurological: She is alert and oriented to person, place, and time.  Sitting on stretcher, NAD. GCS 15. Speaks in a clear voice. Cranial nerves II through XII grossly intact. 5/5 strength in all extremities. Sensation fully intact.  Bilateral finger-to-nose intact. Gait normal   Skin: Skin is warm and dry.  Psychiatric: She has a normal mood and affect. Her behavior is normal.  Nursing note and vitals reviewed.    ED Treatments / Results  Labs (all labs ordered are listed, but only abnormal results are displayed) Labs Reviewed  BASIC METABOLIC PANEL - Abnormal; Notable for the following:       Result Value   Potassium 3.3 (*)    All other components within normal limits  CBC - Abnormal; Notable for the following:    RBC 5.29 (*)    All other components within normal limits  URINALYSIS, ROUTINE W REFLEX MICROSCOPIC (NOT AT [facility]) - Abnormal; Notable for the following:    Color, Urine AMBER (*)    Bilirubin Urine SMALL (*)    Leukocytes, UA TRACE (*)    All other components within normal limits  URINE MICROSCOPIC-ADD ON - Abnormal;  Notable for the following:    Squamous Epithelial / LPF 6-30 (*)    All other components within normal limits  CBG MONITORING, ED - Abnormal; Notable for the following:    Glucose-Capillary 112 (*)    All other components within normal limits    EKG  EKG Interpretation  Date/Time:  Monday December 03 2015 17:03:35 EST Ventricular Rate:  81 PR Interval:    QRS Duration: 81 QT Interval:  380 QTC Calculation: 442 R Axis:   -62 Text Interpretation:  Sinus rhythm Probable left atrial enlargement LAD, consider LAFB or inferior infarct Abnormal R-wave progression, early transition Consider anterior infarct No significant change since last tracing Confirmed by Ethelda Chick  MD, SAM [phone]) on 12/03/2015 5:08:28 PM       Radiology Dg Lumbar Spine Complete  Result Date: 12/03/2015 CLINICAL DATA:  Acute onset of lower back pain after loss of consciousness in bathroom. Initial encounter. EXAM: LUMBAR  SPINE - COMPLETE 4+ VIEW COMPARISON:  CT of the abdomen and pelvis from 03/09/2013 FINDINGS: There is no evidence of fracture or subluxation. Vertebral bodies demonstrate normal height and alignment. Intervertebral disc spaces are preserved. The visualized neural foramina are grossly unremarkable in appearance. The visualized bowel gas pattern is unremarkable in appearance; air and stool are noted within the colon. The sacroiliac joints are within normal limits. A calcified uterine fibroid is noted. IMPRESSION: No evidence of fracture or subluxation along the lumbar spine. Electronically Signed   By: Roanna Raider M.D.   On: 12/03/2015 18:51   Dg Forearm Right  Result Date: 12/03/2015 CLINICAL DATA:  Status post fall, with right forearm pain. Initial encounter. EXAM: RIGHT FOREARM - 2 VIEW COMPARISON:  None. FINDINGS: There is no evidence of fracture or dislocation. The radius and ulna appear intact. The elbow joint is unremarkable. No elbow joint effusion is identified. The carpal rows appear grossly  intact, and demonstrate normal alignment. A peripheral IV catheter is noted overlying the distal forearm. IMPRESSION: No evidence of fracture or dislocation. Electronically Signed   By: Roanna Raider M.D.   On: 12/03/2015 18:54   Ct Head Wo Contrast  Result Date: 12/03/2015 CLINICAL DATA:  Multiple falls over the past few months. Syncope. Concern for head injury. Initial encounter. EXAM: CT HEAD WITHOUT CONTRAST TECHNIQUE: Contiguous axial images were obtained from the base of the skull through the vertex without intravenous contrast. COMPARISON:  CT of the head performed 07/31/2012 FINDINGS: Brain: No evidence of acute infarction, hemorrhage, hydrocephalus, extra-axial collection or mass lesion/mass effect. The posterior fossa, including the cerebellum, brainstem and fourth ventricle, is within normal limits. The third and lateral ventricles, and basal ganglia are unremarkable in appearance. The cerebral hemispheres are symmetric in appearance, with normal gray-white differentiation. No mass effect or midline shift is seen. Vascular: No hyperdense vessel or unexpected calcification. Skull: There is no evidence of fracture; visualized osseous structures are unremarkable in appearance. Sinuses/Orbits: The visualized portions of the orbits are within normal limits. The paranasal sinuses and mastoid air cells are well-aerated. Other: No significant soft tissue abnormalities are seen. IMPRESSION: Unremarkable noncontrast CT of the head. Electronically Signed   By: Roanna Raider M.D.   On: 12/03/2015 19:32   Dg Hand Complete Right  Result Date: 12/03/2015 CLINICAL DATA:  Status post fall, with right hand pain. Initial encounter. EXAM: RIGHT HAND - COMPLETE 3+ VIEW COMPARISON:  None. FINDINGS: There is no evidence of fracture or dislocation. The joint spaces are preserved. The carpal rows are intact, and demonstrate normal alignment. The soft tissues are unremarkable in appearance. IMPRESSION: No evidence of  fracture or dislocation. Electronically Signed   By: Roanna Raider M.D.   On: 12/03/2015 18:52    Procedures Procedures (including critical care time)  Medications Ordered in ED Medications  HYDROcodone-acetaminophen (NORCO/VICODIN) 5-325 MG per tablet 1 tablet (1 tablet Oral Given 12/03/15 1923)     Initial Impression / Assessment and Plan / ED Course  I have reviewed the triage vital signs and the nursing notes.  Pertinent labs & imaging results that were available during my care of the patient were reviewed by me and considered in my medical decision making (see chart for details).  Clinical Course    45 year old female presents with multiple complaints after a syncopal episode. Unclear etiology. Possibly vasovagal vs anxiety. EKG shows SR. CT head negative. xrays unremarkable. Labs are unremarkable. UA is clean. Pain treated in ED. Advised follow up  with primary care. Patient is NAD, non-toxic, with stable VS. Patient is informed of clinical course, understands medical decision making process, and agrees with plan. Opportunity for questions provided and all questions answered. Return precautions given.   Final Clinical Impressions(s) / ED Diagnoses   Final diagnoses:  Syncope, unspecified syncope type  Midline low back pain without sciatica, unspecified chronicity    New Prescriptions Discharge Medication List as of 12/03/2015  9:18 PM       Bethel BornKelly Marie Corby Villasenor, PA-C 12/05/15 0016    Azalia BilisKevin Campos, MD 12/10/15 [phone]

## 2015-12-03 NOTE — ED Notes (Signed)
CBG 112.  

## 2015-12-03 NOTE — ED Notes (Signed)
Inquired about 45 year old daughter, patient and visitor state 45 year old has behavioral problems, has been "AWOL," or not home, since Saturday. Patient and visitor state that police, school, and social work have already been involved in this situation regarding the 161 year old daughter not being home since Saturday.

## 2015-12-03 NOTE — ED Notes (Signed)
Unable to collect labs because PA is in the room

## 2015-12-03 NOTE — ED Notes (Signed)
Patient reminded of the need for urine. 

## 2015-12-03 NOTE — ED Notes (Signed)
Patient transported to X-ray 

## 2017-03-11 ENCOUNTER — Emergency Department (HOSPITAL_COMMUNITY)
Admission: EM | Admit: 2017-03-11 | Discharge: 2017-03-12 | Disposition: A | Discharge status: Home / Self Care | Payer: Medicaid Other | Attending: Emergency Medicine | Admitting: Emergency Medicine

## 2017-03-11 ENCOUNTER — Emergency Department (HOSPITAL_COMMUNITY): Payer: Medicaid Other

## 2017-03-11 ENCOUNTER — Encounter (HOSPITAL_COMMUNITY): Payer: Self-pay

## 2017-03-11 DIAGNOSIS — R1011 Right upper quadrant pain: Secondary | ICD-10-CM | POA: Diagnosis not present

## 2017-03-11 DIAGNOSIS — R112 Nausea with vomiting, unspecified: Secondary | ICD-10-CM

## 2017-03-11 DIAGNOSIS — Z79899 Other long term (current) drug therapy: Secondary | ICD-10-CM | POA: Insufficient documentation

## 2017-03-11 DIAGNOSIS — R10811 Right upper quadrant abdominal tenderness: Secondary | ICD-10-CM

## 2017-03-11 DIAGNOSIS — R739 Hyperglycemia, unspecified: Secondary | ICD-10-CM

## 2017-03-11 DIAGNOSIS — Z7902 Long term (current) use of antithrombotics/antiplatelets: Secondary | ICD-10-CM | POA: Diagnosis not present

## 2017-03-11 DIAGNOSIS — E1165 Type 2 diabetes mellitus with hyperglycemia: Secondary | ICD-10-CM | POA: Diagnosis not present

## 2017-03-11 DIAGNOSIS — I10 Essential (primary) hypertension: Secondary | ICD-10-CM | POA: Diagnosis not present

## 2017-03-11 DIAGNOSIS — F17210 Nicotine dependence, cigarettes, uncomplicated: Secondary | ICD-10-CM | POA: Diagnosis not present

## 2017-03-11 DIAGNOSIS — Z7984 Long term (current) use of oral hypoglycemic drugs: Secondary | ICD-10-CM | POA: Insufficient documentation

## 2017-03-11 LAB — URINALYSIS, ROUTINE W REFLEX MICROSCOPIC
Bacteria, UA: NONE SEEN
Bilirubin Urine: NEGATIVE
Hgb urine dipstick: NEGATIVE
Ketones, ur: 5 mg/dL — AB
Leukocytes, UA: NEGATIVE
Nitrite: NEGATIVE
PH: 5 (ref 5.0–8.0)
PROTEIN: NEGATIVE mg/dL
Specific Gravity, Urine: 1.031 — ABNORMAL HIGH (ref 1.005–1.030)

## 2017-03-11 LAB — COMPREHENSIVE METABOLIC PANEL
ALT: 39 U/L (ref 14–54)
AST: 25 U/L (ref 15–41)
Albumin: 4.2 g/dL (ref 3.5–5.0)
Alkaline Phosphatase: 134 U/L — ABNORMAL HIGH (ref 38–126)
Anion gap: 14 (ref 5–15)
BUN: 19 mg/dL (ref 6–20)
CHLORIDE: 93 mmol/L — AB (ref 101–111)
CO2: 24 mmol/L (ref 22–32)
CREATININE: 1.08 mg/dL — AB (ref 0.44–1.00)
Calcium: 10.1 mg/dL (ref 8.9–10.3)
GFR calc Af Amer: >60 mL/min (ref >60)
GLUCOSE: 592 mg/dL — AB (ref 65–99)
Potassium: 4.7 mmol/L (ref 3.5–5.1)
SODIUM: 131 mmol/L — AB (ref 135–145)
Total Bilirubin: 0.9 mg/dL (ref 0.3–1.2)
Total Protein: 7.7 g/dL (ref 6.5–8.1)

## 2017-03-11 LAB — CBC
HCT: 49 % — ABNORMAL HIGH (ref 36.0–46.0)
Hemoglobin: 16.5 g/dL — ABNORMAL HIGH (ref 12.0–15.0)
MCH: 27.8 pg (ref 26.0–34.0)
MCHC: 33.7 g/dL (ref 30.0–36.0)
MCV: 82.6 fL (ref 78.0–100.0)
PLATELETS: 265 10*3/uL (ref 150–400)
RBC: 5.93 MIL/uL — ABNORMAL HIGH (ref 3.87–5.11)
RDW: 13.1 % (ref 11.5–15.5)
WBC: 10.4 10*3/uL (ref 4.0–10.5)

## 2017-03-11 LAB — CBG MONITORING, ED
GLUCOSE-CAPILLARY: 407 mg/dL — AB (ref 65–99)
Glucose-Capillary: 591 mg/dL (ref 65–99)

## 2017-03-11 LAB — LIPASE, BLOOD: LIPASE: 32 U/L (ref 11–51)

## 2017-03-11 MED ORDER — DICYCLOMINE HCL 10 MG/ML IM SOLN
20.0000 mg | Freq: Once | INTRAMUSCULAR | Status: AC
  Administered 2017-03-11: 20 mg via INTRAMUSCULAR
  Filled 2017-03-11: qty 2

## 2017-03-11 MED ORDER — ONDANSETRON HCL 4 MG/2ML IJ SOLN
4.0000 mg | Freq: Once | INTRAMUSCULAR | Status: AC
  Administered 2017-03-11: 4 mg via INTRAVENOUS
  Filled 2017-03-11: qty 2

## 2017-03-11 MED ORDER — SODIUM CHLORIDE 0.9 % IV BOLUS (SEPSIS)
1000.0000 mL | Freq: Once | INTRAVENOUS | Status: AC
  Administered 2017-03-11: 1000 mL via INTRAVENOUS

## 2017-03-11 NOTE — ED Triage Notes (Signed)
Pt complains of abdominal pain and vomiting since last night, vomiting starts after eating Pt still has her gallbladder

## 2017-03-11 NOTE — ED Provider Notes (Signed)
[facility]-EMERGENCY DEPT Provider Note   CSN: [id number] Arrival date & time: 03/11/17  1909     History   Chief Complaint Chief Complaint  Patient presents with  . Abdominal Pain    HPI Renee Mccann is a 47 y.o. female.  HPI   47 year old female with a history diabetes, hypertension, history of appendectomy many years ago in [place], presents with concern for nausea, vomiting and abdominal pain.  Reports that the abdominal pain and vomiting started last night.  She has vomited over 10-15 times today.  Reports that after she eats, she develops abdominal pain which is diffuse in nature, starting in the lower part of abdomen and radiating around and up to the right upper quadrant.  Denies diarrhea or constipation.  Denies fevers.  Denies dysuria.  Son reports he has been sick with cough, his girlfriend has had similar symptoms of vomiting.   Son translating from [place] at bedside.  Past Medical History:  Diagnosis Date  . Arthritis   . Chronic back pain   . Diabetes mellitus without complication (HCC)    Type II  . Hypertension   . Hypokalemia   . Pyelonephritis     Patient Active Problem List   Diagnosis Date Noted  . S/P appendectomy 02/08/2013  . HTN (hypertension) 02/08/2013    History reviewed. No pertinent surgical history.  OB History    No data available       Home Medications    Prior to Admission medications   Medication Sig Start Date End Date Taking? Authorizing Provider  amLODipine (NORVASC) 10 MG tablet Take 10 mg by mouth daily.   Yes [provider]  gabapentin (NEURONTIN) 300 MG capsule Take 300 mg by mouth at bedtime.   Yes [provider]  lisinopril-hydrochlorothiazide (PRINZIDE,ZESTORETIC) 20-12.5 MG tablet Take 2 tablets by mouth daily.   Yes [provider]  metFORMIN (GLUCOPHAGE) 500 MG tablet Take 250 mg by mouth daily.  10/31/15  Yes [provider]  omeprazole (PRILOSEC) 20  MG capsule Take 20 mg by mouth daily.   Yes [provider]  pravastatin (PRAVACHOL) 20 MG tablet Take 20 mg by mouth at bedtime. 10/09/15  Yes [provider]  acetaminophen (TYLENOL) 500 MG tablet Take 1,000 mg by mouth every 6 (six) hours as needed for moderate pain.    [provider]  dicyclomine (BENTYL) 20 MG tablet Take 1 tablet (20 mg total) by mouth 2 (two) times daily as needed for spasms (abdominal pain). 03/12/17   Alvira Monday, MD  ondansetron (ZOFRAN ODT) 4 MG disintegrating tablet Take 1 tablet (4 mg total) by mouth every 8 (eight) hours as needed for nausea or vomiting. 03/12/17   Alvira Monday, MD    Family History History reviewed. No pertinent family history.  Social History Social History   Tobacco Use  . Smoking status: Current Some Day Smoker    Types: Cigarettes  . Smokeless tobacco: Never Used  Substance Use Topics  . Alcohol use: No  . Drug use: No     Allergies   Other   Review of Systems Review of Systems  Constitutional: Positive for appetite change. Negative for fever.  HENT: Negative for sore throat.   Eyes: Negative for visual disturbance.  Respiratory: Negative for cough and shortness of breath.   Cardiovascular: Negative for chest pain.  Gastrointestinal: Positive for abdominal pain, nausea and vomiting. Negative for constipation and diarrhea.  Genitourinary: Negative for difficulty urinating and dysuria.  Musculoskeletal: Negative for back pain and neck pain.  Skin: Negative for rash.  Neurological: Negative for syncope and headaches.     Physical Exam Updated Vital Signs BP 101/66   Pulse 81   Temp 98.1 F (36.7 C) (Oral)   Resp 17   SpO2 99%   Physical Exam  Constitutional: She is oriented to person, place, and time. She appears well-developed and well-nourished. No distress.  HENT:  Head: Normocephalic and atraumatic.  Eyes: Conjunctivae and EOM are normal.  Neck: Normal range of motion.    Cardiovascular: Normal rate, regular rhythm, normal heart sounds and intact distal pulses. Exam reveals no gallop and no friction rub.  No murmur heard. Pulmonary/Chest: Effort normal and breath sounds normal. No respiratory distress. She has no wheezes. She has no rales.  Abdominal: Soft. She exhibits no distension. There is tenderness in the right upper quadrant, right lower quadrant and epigastric area. There is no guarding and negative Murphy's sign.  Musculoskeletal: She exhibits no edema or tenderness.  Neurological: She is alert and oriented to person, place, and time.  Skin: Skin is warm and dry. No rash noted. She is not diaphoretic. No erythema.  Nursing note and vitals reviewed.    ED Treatments / Results  Labs (all labs ordered are listed, but only abnormal results are displayed) Labs Reviewed  COMPREHENSIVE METABOLIC PANEL - Abnormal; Notable for the following components:      Result Value   Sodium 131 (*)    Chloride 93 (*)    Glucose, Bld 592 (*)    Creatinine, Ser 1.08 (*)    Alkaline Phosphatase 134 (*)    All other components within normal limits  CBC - Abnormal; Notable for the following components:   RBC 5.93 (*)    Hemoglobin 16.5 (*)    HCT 49.0 (*)    All other components within normal limits  URINALYSIS, ROUTINE W REFLEX MICROSCOPIC - Abnormal; Notable for the following components:   Specific Gravity, Urine 1.031 (*)    Glucose, UA >=500 (*)    Ketones, ur 5 (*)    Squamous Epithelial / LPF 0-5 (*)    All other components within normal limits  CBG MONITORING, ED - Abnormal; Notable for the following components:   Glucose-Capillary 591 (*)    All other components within normal limits  CBG MONITORING, ED - Abnormal; Notable for the following components:   Glucose-Capillary 407 (*)    All other components within normal limits  LIPASE, BLOOD  I-STAT BETA HCG BLOOD, ED ([facility], [facility], [facility] ONLY)    EKG  EKG Interpretation None       Radiology US Abdomen  Limited Ruq  Result Date: 03/11/2017 CLINICAL DATA:  Initial evaluation for right upper quadrant abdominal tenderness. EXAM: ULTRASOUND ABDOMEN LIMITED RIGHT UPPER QUADRANT COMPARISON:  None. FINDINGS: Gallbladder: No gallstones or wall thickening visualized. No sonographic Murphy sign noted by sonographer. Common bile duct: Diameter: 2.5 mm Liver: Diffusely increased hepatic echogenicity, suggesting steatosis. Few somewhat wedge-shaped areas of hypodensity within the peripheral liver favored to reflect focal fatty sparing. Portal vein is patent on color Doppler imaging with normal direction of blood flow towards the liver. IMPRESSION: 1. Normal sonographic appearance of the gallbladder. No biliary dilatation. 2. Hepatic steatosis with probable scattered areas of focal fatty sparing. Electronically Signed   By: Rise Mu M.D.   On: 03/11/2017 22:30    Procedures Procedures (including critical care time)  Medications Ordered in ED Medications  sodium chloride 0.9 %  bolus 1,000 mL (0 mLs Intravenous Stopped 03/12/17 0049)  ondansetron (ZOFRAN) injection 4 mg (4 mg Intravenous Given 03/11/17 2103)  dicyclomine (BENTYL) injection 20 mg (20 mg Intramuscular Given 03/11/17 2216)  sodium chloride 0.9 % bolus 1,000 mL (0 mLs Intravenous Stopped 03/12/17 0049)  insulin aspart (novoLOG) injection 8 Units (8 Units Intravenous Given 03/12/17 0012)     Initial Impression / Assessment and Plan / ED Course  I have reviewed the triage vital signs and the nursing notes.  Pertinent labs & imaging results that were available during my care of the patient were reviewed by me and considered in my medical decision making (see chart for details).    47 year old female with a history diabetes, hypertension, history of appendectomy many years ago in [place], presents with concern for nausea, vomiting and abdominal pain.  Given epigastric and right upper quadrant pain, right of her ultrasound was ordered which  showed no of cholelithiasis, and did show fatty liver.  Patient has had her appendix removed, and have low suspicion for other acute intra-abdominal process, including low suspicion for acute small bowel obstruction or diverticulitis by history and exam.  Labs are significant with hyperglycemia without signs of DKA.  Suspect abdominal pain, nausea and vomiting is most likely secondary to viral infection, versus possible diabetic gastroparesis.  She was given 2 L of normal saline with improvement of her glucose from 591 down to 407.  She is given Zofran and Bentyl with significant improvement of her symptoms.  She was able to tolerate p.o. Fluids. Provided [place] insulin.  Gave rx for zofran, bentyl and recommend continued supportive care and discussed reasons to return in detail. Patient discharged in stable condition with understanding of reasons to return.   Final Clinical Impressions(s) / ED Diagnoses   Final diagnoses:  RUQ abdominal tenderness  Non-intractable vomiting with nausea, unspecified vomiting type  Hyperglycemia    ED Discharge Orders        Ordered    ondansetron (ZOFRAN ODT) 4 MG disintegrating tablet  Every 8 hours PRN     03/12/17 0043    dicyclomine (BENTYL) 20 MG tablet  2 times daily PRN     03/12/17 0043       Alvira MondaySchlossman, Vansh Reckart, MD 03/12/17 0201

## 2017-03-12 MED ORDER — INSULIN ASPART 100 UNIT/ML ~~LOC~~ SOLN
8.0000 [IU] | Freq: Once | SUBCUTANEOUS | Status: AC
  Administered 2017-03-12: 8 [IU] via INTRAVENOUS
  Filled 2017-03-12: qty 1

## 2017-03-12 MED ORDER — DICYCLOMINE HCL 20 MG PO TABS: 20.0000 mg | ORAL_TABLET | Freq: Two times a day (BID) | ORAL | 0 refills | Status: AC | PRN

## 2017-03-12 MED ORDER — ONDANSETRON 4 MG PO TBDP: 4.0000 mg | ORAL_TABLET | Freq: Three times a day (TID) | ORAL | 0 refills | Status: AC | PRN

## 2017-12-18 ENCOUNTER — Encounter (HOSPITAL_COMMUNITY): Payer: Self-pay

## 2017-12-18 ENCOUNTER — Other Ambulatory Visit: Payer: Self-pay

## 2017-12-18 ENCOUNTER — Emergency Department (HOSPITAL_COMMUNITY)
Admission: EM | Admit: 2017-12-18 | Discharge: 2017-12-19 | Disposition: A | Discharge status: Home / Self Care | Payer: Medicaid Other | Attending: Emergency Medicine | Admitting: Emergency Medicine

## 2017-12-18 ENCOUNTER — Emergency Department (HOSPITAL_COMMUNITY): Payer: Medicaid Other

## 2017-12-18 DIAGNOSIS — I10 Essential (primary) hypertension: Secondary | ICD-10-CM | POA: Insufficient documentation

## 2017-12-18 DIAGNOSIS — Z79899 Other long term (current) drug therapy: Secondary | ICD-10-CM | POA: Diagnosis not present

## 2017-12-18 DIAGNOSIS — E1165 Type 2 diabetes mellitus with hyperglycemia: Secondary | ICD-10-CM | POA: Diagnosis not present

## 2017-12-18 DIAGNOSIS — R059 Cough, unspecified: Secondary | ICD-10-CM

## 2017-12-18 DIAGNOSIS — F17210 Nicotine dependence, cigarettes, uncomplicated: Secondary | ICD-10-CM | POA: Insufficient documentation

## 2017-12-18 DIAGNOSIS — R1030 Lower abdominal pain, unspecified: Secondary | ICD-10-CM | POA: Insufficient documentation

## 2017-12-18 DIAGNOSIS — R05 Cough: Secondary | ICD-10-CM

## 2017-12-18 DIAGNOSIS — R109 Unspecified abdominal pain: Secondary | ICD-10-CM

## 2017-12-18 DIAGNOSIS — Z7984 Long term (current) use of oral hypoglycemic drugs: Secondary | ICD-10-CM | POA: Diagnosis not present

## 2017-12-18 DIAGNOSIS — R739 Hyperglycemia, unspecified: Secondary | ICD-10-CM

## 2017-12-18 LAB — I-STAT BETA HCG BLOOD, ED (NOT ORDERABLE): HCG, QUANTITATIVE: 16.9 m[IU]/mL — AB (ref <5)

## 2017-12-18 LAB — COMPREHENSIVE METABOLIC PANEL
ALBUMIN: 4 g/dL (ref 3.5–5.0)
ALK PHOS: 104 U/L (ref 38–126)
ALT: 27 U/L (ref 0–44)
ANION GAP: 13 (ref 5–15)
AST: 23 U/L (ref 15–41)
BUN: 17 mg/dL (ref 6–20)
CALCIUM: 9.4 mg/dL (ref 8.9–10.3)
CHLORIDE: 97 mmol/L — AB (ref 98–111)
CO2: 29 mmol/L (ref 22–32)
Creatinine, Ser: 1.01 mg/dL — ABNORMAL HIGH (ref 0.44–1.00)
GFR calc Af Amer: >60 mL/min (ref >60)
GFR calc non Af Amer: >60 mL/min (ref >60)
GLUCOSE: 260 mg/dL — AB (ref 70–99)
POTASSIUM: 3.9 mmol/L (ref 3.5–5.1)
SODIUM: 139 mmol/L (ref 135–145)
Total Bilirubin: 0.9 mg/dL (ref 0.3–1.2)
Total Protein: 7.4 g/dL (ref 6.5–8.1)

## 2017-12-18 LAB — CBC
HEMATOCRIT: 51.8 % — AB (ref 36.0–46.0)
HEMOGLOBIN: 16.1 g/dL — AB (ref 12.0–15.0)
MCH: 27 pg (ref 26.0–34.0)
MCHC: 31.1 g/dL (ref 30.0–36.0)
MCV: 86.9 fL (ref 80.0–100.0)
Platelets: 223 10*3/uL (ref 150–400)
RBC: 5.96 MIL/uL — ABNORMAL HIGH (ref 3.87–5.11)
RDW: 13.6 % (ref 11.5–15.5)
WBC: 8.9 10*3/uL (ref 4.0–10.5)
nRBC: 0 % (ref 0.0–0.2)

## 2017-12-18 LAB — LIPASE, BLOOD: Lipase: 24 U/L (ref 11–51)

## 2017-12-18 LAB — CBG MONITORING, ED: Glucose-Capillary: 282 mg/dL — ABNORMAL HIGH (ref 70–99)

## 2017-12-18 NOTE — ED Provider Notes (Signed)
[facility]-EMERGENCY DEPT Provider Note  CSN: [id number] Arrival date & time: 12/18/17 2009  Chief Complaint(s) Abdominal Pain and Cough  HPI Renee Mccann is a 47 y.o. female    Abdominal Pain   This is a new problem. The current episode started 2 days ago. The problem occurs constantly. Progression since onset: fluctuating. The pain is located in the suprapubic region. The quality of the pain is aching and cramping. The pain is moderate. Associated symptoms include anorexia. Pertinent negatives include fever, diarrhea, hematochezia, melena, nausea, vomiting, dysuria and frequency. Nothing aggravates the symptoms. Nothing relieves the symptoms. Her past medical history does not include gallstones, ulcerative colitis or Crohn's disease.  Cough  This is a new problem. The current episode started yesterday. The problem occurs every few minutes. The problem has not changed since onset.The cough is productive of blood-tinged sputum. There has been no fever. Associated symptoms include chest pain (only with coughing). Pertinent negatives include no shortness of breath. She is a smoker.   LMP was 6 yrs ago. Postmenopausal.  Patient was seen by urgent care earlier today for symptoms and instructed to present to the emergency department to be assessed for possible diabetic ketoacidosis.   Past Medical History Past Medical History:  Diagnosis Date  . Arthritis   . Chronic back pain   . Diabetes mellitus without complication (HCC)    Type II  . Hypertension   . Hypokalemia   . Pyelonephritis    Patient Active Problem List   Diagnosis Date Noted  . S/P appendectomy 02/08/2013  . HTN (hypertension) 02/08/2013   Home Medication(s) Prior to Admission medications   Medication Sig Start Date End Date Taking? Authorizing Provider  amLODipine (NORVASC) 10 MG tablet Take 10 mg by mouth daily.   Yes [provider]  empagliflozin (JARDIANCE) 25 MG TABS tablet Take  25 mg by mouth daily.   Yes [provider]  gabapentin (NEURONTIN) 300 MG capsule Take 300 mg by mouth at bedtime.   Yes [provider]  lisinopril-hydrochlorothiazide (PRINZIDE,ZESTORETIC) 20-12.5 MG tablet Take 2 tablets by mouth daily.   Yes [provider]  nortriptyline (PAMELOR) 25 MG capsule Take 25 mg by mouth at bedtime.   Yes [provider]  pravastatin (PRAVACHOL) 20 MG tablet Take 20 mg by mouth at bedtime. 10/09/15  Yes [provider]  acetaminophen (TYLENOL) 500 MG tablet Take 1,000 mg by mouth every 6 (six) hours as needed for moderate pain.    [provider]  dicyclomine (BENTYL) 20 MG tablet Take 1 tablet (20 mg total) by mouth 2 (two) times daily as needed for spasms (abdominal pain). Patient not taking: Reported on 12/18/2017 03/12/17   Alvira Monday, MD  ondansetron (ZOFRAN ODT) 4 MG disintegrating tablet Take 1 tablet (4 mg total) by mouth every 8 (eight) hours as needed for nausea or vomiting. Patient not taking: Reported on 12/18/2017 03/12/17   Alvira Monday, MD  Past Surgical History History reviewed. No pertinent surgical history. Family History History reviewed. No pertinent family history.  Social History Social History   Tobacco Use  . Smoking status: Current Some Day Smoker    Types: Cigarettes  . Smokeless tobacco: Never Used  Substance Use Topics  . Alcohol use: No  . Drug use: No   Allergies Other  Review of Systems Review of Systems  Constitutional: Negative for fever.  Respiratory: Positive for cough. Negative for shortness of breath.   Cardiovascular: Positive for chest pain (only with coughing).  Gastrointestinal: Positive for abdominal pain and anorexia. Negative for diarrhea, hematochezia, melena, nausea and vomiting.  Genitourinary: Negative for dysuria  and frequency.   All other systems are reviewed and are negative for acute change except as noted in the HPI  Physical Exam Vital Signs  I have reviewed the triage vital signs BP 110/87 (BP Location: Left Arm)   Pulse 90   Temp 98.6 F (37 C) (Oral)   Resp 18   Ht 5\' 3"  (1.6 m)   Wt 68 kg   SpO2 98%   BMI 26.57 kg/m   Physical Exam  Constitutional: She is oriented to person, place, and time. She appears well-developed and well-nourished. No distress.  HENT:  Head: Normocephalic and atraumatic.  Nose: Nose normal.  Eyes: Pupils are equal, round, and reactive to light. Conjunctivae and EOM are normal. Right eye exhibits no discharge. Left eye exhibits no discharge. No scleral icterus.  Neck: Normal range of motion. Neck supple.  Cardiovascular: Normal rate and regular rhythm. Exam reveals no gallop and no friction rub.  No murmur heard. Pulmonary/Chest: Effort normal and breath sounds normal. No stridor. No respiratory distress. She has no rales.  Abdominal: Soft. She exhibits no distension. There is tenderness (discomfort) in the suprapubic area. There is no rigidity, no rebound, no guarding, no CVA tenderness, no tenderness at McBurney's point and negative Murphy's sign. No hernia.  Musculoskeletal: She exhibits no edema or tenderness.  Neurological: She is alert and oriented to person, place, and time.  Skin: Skin is warm and dry. No rash noted. She is not diaphoretic. No erythema.  Psychiatric: She has a normal mood and affect.  Vitals reviewed.   ED Results and Treatments Labs (all labs ordered are listed, but only abnormal results are displayed) Labs Reviewed  COMPREHENSIVE METABOLIC PANEL - Abnormal; Notable for the following components:      Result Value   Chloride 97 (*)    Glucose, Bld 260 (*)    Creatinine, Ser 1.01 (*)    All other components within normal limits  CBC - Abnormal; Notable for the following components:   RBC 5.96 (*)    Hemoglobin 16.1 (*)     HCT 51.8 (*)    All other components within normal limits  URINALYSIS, ROUTINE W REFLEX MICROSCOPIC - Abnormal; Notable for the following components:   Specific Gravity, Urine 1.040 (*)    Glucose, UA >=500 (*)    Ketones, ur 20 (*)    Bacteria, UA RARE (*)    All other components within normal limits  CBG MONITORING, ED - Abnormal; Notable for the following components:   Glucose-Capillary 282 (*)    All other components within normal limits  I-STAT BETA HCG BLOOD, ED (NOT ORDERABLE) - Abnormal; Notable for the following components:   I-stat hCG, quantitative 16.9 (*)    All other components within normal limits  LIPASE, BLOOD  I-STAT BETA HCG BLOOD, ED ([facility],  [facility], [facility] ONLY)  POCT PREGNANCY, URINE                                                                                                                         EKG  EKG Interpretation  Date/Time:    Ventricular Rate:    PR Interval:    QRS Duration:   QT Interval:    QTC Calculation:   R Axis:     Text Interpretation:        Radiology Dg Chest 2 View  Result Date: 12/18/2017 CLINICAL DATA:  Pt states she was in [place] for a month and came back 2 days ago. Pt states she has had a productive cough and abd pain for since last night. PT HX: DM, HTN, current smokerPRODUCTIVE COUGH EXAM: CHEST - 2 VIEW COMPARISON:  06/27/2017 FINDINGS: Normal mediastinum and cardiac silhouette. Normal pulmonary vasculature. No evidence of effusion, infiltrate, or pneumothorax. No acute bony abnormality. IMPRESSION: No acute cardiopulmonary process. Electronically Signed   By: Genevive BiStewart  Edmunds M.D.   On: 12/18/2017 21:54   Pertinent labs & imaging results that were available during my care of the patient were reviewed by me and considered in my medical decision making (see chart for details).  Medications Ordered in ED Medications  alum & mag hydroxide-simeth (MAALOX/MYLANTA) 200-200-20 MG/5ML suspension 30 mL (30 mLs Oral Given 12/19/17 0203)     And  lidocaine (XYLOCAINE) 2 % viscous mouth solution 15 mL (15 mLs Oral Given 12/19/17 0204)                                                                                                                                    Procedures Procedures  (including critical care time)  Medical Decision Making / ED Course I have reviewed the nursing notes for this encounter and the patient's prior records (if available in EHR or on provided paperwork).    Patient has lower abdominal discomfort.  Exam with mild discomfort to palpation.  No peritonitis.  Screening labs grossly reassuring without leukocytosis or anemia.  There is evidence of hemoconcentration on CBC. No significant electrolyte derangements with mild renal insufficiency; likely from dehydration.  Evidence of hyperglycemia without DKA.  UA without evidence of infection.  No evidence of biliary obstruction or pancreatitis.  Chest x-ray obtain due to her cough and negative for pneumonia, pulmonary edema, pleural effusions.  She has no current chest  pain or shortness of breath.  No lower extremity edema.  Doubt PE.  Treated symptomatically with GI cocktail resulting in significant improvement of her abdominal discomfort.  Repeat abdominal exam benign.  The patient appears reasonably screened and/or stabilized for discharge and I doubt any other medical condition or other [facility] requiring further screening, evaluation, or treatment in the ED at this time prior to discharge.  The patient is safe for discharge with strict return precautions.    Final Clinical Impression(s) / ED Diagnoses Final diagnoses:  Abdominal discomfort  Hyperglycemia  Cough   Disposition: Discharge  Condition: Good  I have discussed the results, Dx and Tx plan with the patient who expressed understanding and agree(s) with the plan. Discharge instructions discussed at great length. The patient was given strict return precautions who verbalized understanding of the  instructions. No further questions at time of discharge.    ED Discharge Orders    None       Follow Up: Primary care provider  Schedule an appointment as soon as possible for a visit  in 5-7 days, If symptoms do not improve or  worsen      This chart was dictated using voice recognition software.  Despite best efforts to proofread,  errors can occur which can change the documentation meaning.   Nira Conn, MD 12/19/17 [phone]

## 2017-12-18 NOTE — ED Triage Notes (Signed)
PER THE DAUGHTER, THE PT JUST CAME BACK FROM A [place]TH LONG [place] TRIP 2 DAYS AGO. LAST NIGHT THE PT C/O ABDOMINAL PAIN AND A PRODUCTIVE COUGH. PT DENIES N/V/D, OR FEVER. THE PT WENT TO HER PCP TODAY, AND WAS TOLD TO COME HERE FOR TREATMENT FOR DKA.

## 2017-12-19 LAB — URINALYSIS, ROUTINE W REFLEX MICROSCOPIC
BILIRUBIN URINE: NEGATIVE
Glucose, UA: >=500 mg/dL — AB
HGB URINE DIPSTICK: NEGATIVE
KETONES UR: 20 mg/dL — AB
LEUKOCYTES UA: NEGATIVE
NITRITE: NEGATIVE
PROTEIN: NEGATIVE mg/dL
Specific Gravity, Urine: 1.04 — ABNORMAL HIGH (ref 1.005–1.030)
pH: 5 (ref 5.0–8.0)

## 2017-12-19 LAB — POCT PREGNANCY, URINE: PREG TEST UR: NEGATIVE

## 2017-12-19 MED ORDER — LIDOCAINE VISCOUS HCL 2 % MT SOLN
15.0000 mL | Freq: Once | OROMUCOSAL | Status: AC
  Administered 2017-12-19: 15 mL via ORAL
  Filled 2017-12-19: qty 15

## 2017-12-19 MED ORDER — ALUM & MAG HYDROXIDE-SIMETH 200-200-20 MG/5ML PO SUSP
30.0000 mL | Freq: Once | ORAL | Status: AC
  Administered 2017-12-19: 30 mL via ORAL
  Filled 2017-12-19: qty 30

## 2021-03-01 ENCOUNTER — Emergency Department (HOSPITAL_COMMUNITY)
Admission: EM | Admit: 2021-03-01 | Discharge: 2021-03-02 | Disposition: A | Discharge status: Home / Self Care | Payer: Medicaid Other | Attending: Emergency Medicine | Admitting: Emergency Medicine

## 2021-03-01 ENCOUNTER — Other Ambulatory Visit: Payer: Self-pay

## 2021-03-01 ENCOUNTER — Emergency Department (HOSPITAL_COMMUNITY): Payer: Medicaid Other

## 2021-03-01 ENCOUNTER — Encounter (HOSPITAL_COMMUNITY): Payer: Self-pay

## 2021-03-01 DIAGNOSIS — N200 Calculus of kidney: Secondary | ICD-10-CM

## 2021-03-01 DIAGNOSIS — D72829 Elevated white blood cell count, unspecified: Secondary | ICD-10-CM | POA: Diagnosis not present

## 2021-03-01 DIAGNOSIS — N202 Calculus of kidney with calculus of ureter: Secondary | ICD-10-CM | POA: Insufficient documentation

## 2021-03-01 DIAGNOSIS — R112 Nausea with vomiting, unspecified: Secondary | ICD-10-CM | POA: Diagnosis present

## 2021-03-01 LAB — CBC WITH DIFFERENTIAL/PLATELET
Abs Immature Granulocytes: 0.05 10*3/uL (ref 0.00–0.07)
Basophils Absolute: 0 10*3/uL (ref 0.0–0.1)
Basophils Relative: 0 %
Eosinophils Absolute: 0 10*3/uL (ref 0.0–0.5)
Eosinophils Relative: 0 %
HCT: 50.2 % — ABNORMAL HIGH (ref 36.0–46.0)
Hemoglobin: 15.5 g/dL — ABNORMAL HIGH (ref 12.0–15.0)
Immature Granulocytes: 0 %
Lymphocytes Relative: 13 %
Lymphs Abs: 1.6 10*3/uL (ref 0.7–4.0)
MCH: 26.3 pg (ref 26.0–34.0)
MCHC: 30.9 g/dL (ref 30.0–36.0)
MCV: 85.2 fL (ref 80.0–100.0)
Monocytes Absolute: 0.4 10*3/uL (ref 0.1–1.0)
Monocytes Relative: 4 %
Neutro Abs: 9.7 10*3/uL — ABNORMAL HIGH (ref 1.7–7.7)
Neutrophils Relative %: 83 %
Platelets: 276 10*3/uL (ref 150–400)
RBC: 5.89 MIL/uL — ABNORMAL HIGH (ref 3.87–5.11)
RDW: 14.2 % (ref 11.5–15.5)
WBC: 11.8 10*3/uL — ABNORMAL HIGH (ref 4.0–10.5)
nRBC: 0 % (ref 0.0–0.2)

## 2021-03-01 LAB — COMPREHENSIVE METABOLIC PANEL
ALT: 48 U/L — ABNORMAL HIGH (ref 0–44)
AST: 35 U/L (ref 15–41)
Albumin: 4.4 g/dL (ref 3.5–5.0)
Alkaline Phosphatase: 121 U/L (ref 38–126)
Anion gap: 10 (ref 5–15)
BUN: 20 mg/dL (ref 6–20)
CO2: 26 mmol/L (ref 22–32)
Calcium: 9.9 mg/dL (ref 8.9–10.3)
Chloride: 102 mmol/L (ref 98–111)
Creatinine, Ser: 1.11 mg/dL — ABNORMAL HIGH (ref 0.44–1.00)
GFR, Estimated: >60 mL/min (ref >60)
Glucose, Bld: 256 mg/dL — ABNORMAL HIGH (ref 70–99)
Potassium: 3.5 mmol/L (ref 3.5–5.1)
Sodium: 138 mmol/L (ref 135–145)
Total Bilirubin: 0.4 mg/dL (ref 0.3–1.2)
Total Protein: 8.3 g/dL — ABNORMAL HIGH (ref 6.5–8.1)

## 2021-03-01 LAB — LIPASE, BLOOD: Lipase: 27 U/L (ref 11–51)

## 2021-03-01 MED ORDER — ONDANSETRON 4 MG PO TBDP
4.0000 mg | ORAL_TABLET | Freq: Once | ORAL | Status: AC
  Administered 2021-03-01: 4 mg via ORAL
  Filled 2021-03-01: qty 1

## 2021-03-01 MED ORDER — IOHEXOL 300 MG/ML  SOLN
100.0000 mL | Freq: Once | INTRAMUSCULAR | Status: AC | PRN
  Administered 2021-03-01: 100 mL via INTRAVENOUS

## 2021-03-01 NOTE — ED Provider Triage Note (Signed)
Emergency Medicine Provider Triage Evaluation Note  Renee Mccann , a 51 y.o. female  was evaluated in triage.  Pt complains of left sided back pain that radiates to left side of abdomen associated with nausea. Admits to increased urinary frequency with small amount of urine. No fever or chills. Denies history of kidney stones.   Patient's son translated in triage  Review of Systems  Positive: Back pain, flank pain, abdominal pain Negative: fever  Physical Exam  BP (!) 156/97 (BP Location: Left Arm)    Pulse 97    Temp 97.7 F (36.5 C) (Oral)    Resp (!) 21    Ht 5\' 3"  (1.6 m)    Wt 68 kg    SpO2 99%    BMI 26.56 kg/m  Gen:   Awake, no distress   Resp:  Normal effort  MSK:   Moves extremities without difficulty  Other:  +left CVA tenderness  Medical Decision Making  Medically screening exam initiated at 8:11 PM.  Appropriate orders placed.  Jake Samples was informed that the remainder of the evaluation will be completed by another provider, this initial triage assessment does not replace that evaluation, and the importance of remaining in the ED until their evaluation is complete.  Labs CT abdomen Zofran given in triage   Karie Kirks 03/01/21 2013

## 2021-03-01 NOTE — ED Triage Notes (Signed)
Pain began around 5pm from back that wraps around to her left abdomen. Nauseous. No rash. Patient urinates often but very small amount.

## 2021-03-02 LAB — URINALYSIS, ROUTINE W REFLEX MICROSCOPIC
Bacteria, UA: NONE SEEN
Bilirubin Urine: NEGATIVE
Glucose, UA: NEGATIVE mg/dL
Ketones, ur: NEGATIVE mg/dL
Nitrite: NEGATIVE
Protein, ur: 30 mg/dL — AB
RBC / HPF: >50 RBC/hpf — ABNORMAL HIGH (ref 0–5)
Specific Gravity, Urine: 1.016 (ref 1.005–1.030)
pH: 5 (ref 5.0–8.0)

## 2021-03-02 MED ORDER — ONDANSETRON HCL 4 MG/2ML IJ SOLN
4.0000 mg | Freq: Once | INTRAMUSCULAR | Status: AC
  Administered 2021-03-02: 4 mg via INTRAVENOUS
  Filled 2021-03-02: qty 2

## 2021-03-02 MED ORDER — ONDANSETRON HCL 4 MG PO TABS: 4.0000 mg | ORAL_TABLET | Freq: Three times a day (TID) | ORAL | 0 refills | Status: DC | PRN

## 2021-03-02 MED ORDER — KETOROLAC TROMETHAMINE 15 MG/ML IJ SOLN
15.0000 mg | Freq: Once | INTRAMUSCULAR | Status: AC
  Administered 2021-03-02: 15 mg via INTRAVENOUS
  Filled 2021-03-02: qty 1

## 2021-03-02 MED ORDER — TAMSULOSIN HCL 0.4 MG PO CAPS: 0.4000 mg | ORAL_CAPSULE | Freq: Every day | ORAL | 0 refills | Status: DC

## 2021-03-02 MED ORDER — OXYCODONE-ACETAMINOPHEN 5-325 MG PO TABS: 1.0000 | ORAL_TABLET | Freq: Three times a day (TID) | ORAL | 0 refills | Status: AC | PRN

## 2021-03-02 MED ORDER — FENTANYL CITRATE PF 50 MCG/ML IJ SOSY
50.0000 ug | PREFILLED_SYRINGE | Freq: Once | INTRAMUSCULAR | Status: AC
  Administered 2021-03-02: 50 ug via INTRAVENOUS
  Filled 2021-03-02: qty 1

## 2021-03-02 NOTE — ED Provider Notes (Signed)
[facility] DEPT Provider Note   CSN: [id number] Arrival date & time: 03/01/21  1954     History  Chief Complaint  Patient presents with   Emesis   Back Pain    Renee Mccann Renee Mccann is a 51 y.o. female.  Presented to the emergency department with concern for back pain, nausea, vomiting, flank pain.  Patient reports that she started initially having mild low left back pain but then it seemed to be steadily getting worse throughout the day, evening today.  Then pain is primarily left flank.  Moderate to severe.  Sharp and stabbing.  Noted slight blood tinge in her urine.  Noted increased urinary frequency.  No chills or fevers.  No blood in vomit.  Utilized Optometrist.  HPI     Home Medications Prior to Admission medications   Medication Sig Start Date End Date Taking? Authorizing Provider  acetaminophen (TYLENOL) 500 MG tablet Take 1,000 mg by mouth every 6 (six) hours as needed for moderate pain.    [provider]  amLODipine (NORVASC) 10 MG tablet Take 10 mg by mouth daily.    [provider]  dicyclomine (BENTYL) 20 MG tablet Take 1 tablet (20 mg total) by mouth 2 (two) times daily as needed for spasms (abdominal pain). Patient not taking: Reported on 12/18/2017 03/12/17   Gareth Morgan, MD  empagliflozin (JARDIANCE) 25 MG TABS tablet Take 25 mg by mouth daily.    [provider]  gabapentin (NEURONTIN) 300 MG capsule Take 300 mg by mouth at bedtime.    [provider]  lisinopril-hydrochlorothiazide (PRINZIDE,ZESTORETIC) 20-12.5 MG tablet Take 2 tablets by mouth daily.    [provider]  nortriptyline (PAMELOR) 25 MG capsule Take 25 mg by mouth at bedtime.    [provider]  ondansetron (ZOFRAN ODT) 4 MG disintegrating tablet Take 1 tablet (4 mg total) by mouth every 8 (eight) hours as needed for nausea or vomiting. Patient not taking: Reported on 12/18/2017 03/12/17   Gareth Morgan, MD   pravastatin (PRAVACHOL) 20 MG tablet Take 20 mg by mouth at bedtime. 10/09/15   [provider]      Allergies    Other    Review of Systems   Review of Systems  Constitutional:  Negative for chills and fever.  HENT:  Negative for ear pain and sore throat.   Eyes:  Negative for pain and visual disturbance.  Respiratory:  Negative for cough and shortness of breath.   Cardiovascular:  Negative for chest pain and palpitations.  Gastrointestinal:  Positive for abdominal pain. Negative for vomiting.  Genitourinary:  Positive for flank pain. Negative for dysuria and hematuria.  Musculoskeletal:  Positive for back pain. Negative for arthralgias.  Skin:  Negative for color change and rash.  Neurological:  Negative for seizures and syncope.  All other systems reviewed and are negative.  Physical Exam Updated Vital Signs BP 116/82    Pulse 85    Temp 97.7 F (36.5 C) (Oral)    Resp 20    Ht 5\' 3"  (1.6 m)    Wt 68 kg    SpO2 95%    BMI 26.56 kg/m  Physical Exam Vitals and nursing note reviewed.  Constitutional:      General: She is not in acute distress.    Appearance: She is well-developed.  HENT:     Head: Normocephalic and atraumatic.  Eyes:     Conjunctiva/sclera: Conjunctivae normal.  Cardiovascular:  Rate and Rhythm: Normal rate and regular rhythm.     Heart sounds: No murmur heard. Pulmonary:     Effort: Pulmonary effort is normal. No respiratory distress.     Breath sounds: Normal breath sounds.  Abdominal:     Palpations: Abdomen is soft.     Tenderness: There is no abdominal tenderness.     Comments: Mild tenderness to left flank  Musculoskeletal:        General: No swelling.     Cervical back: Neck supple.  Skin:    General: Skin is warm and dry.     Capillary Refill: Capillary refill takes less than 2 seconds.  Neurological:     Mental Status: She is alert.  Psychiatric:        Mood and Affect: Mood normal.    ED Results / Procedures / Treatments    Labs (all labs ordered are listed, but only abnormal results are displayed) Labs Reviewed  CBC WITH DIFFERENTIAL/PLATELET - Abnormal; Notable for the following components:      Result Value   WBC 11.8 (*)    RBC 5.89 (*)    Hemoglobin 15.5 (*)    HCT 50.2 (*)    Neutro Abs 9.7 (*)    All other components within normal limits  COMPREHENSIVE METABOLIC PANEL - Abnormal; Notable for the following components:   Glucose, Bld 256 (*)    Creatinine, Ser 1.11 (*)    Total Protein 8.3 (*)    ALT 48 (*)    All other components within normal limits  URINALYSIS, ROUTINE W REFLEX MICROSCOPIC - Abnormal; Notable for the following components:   Color, Urine AMBER (*)    APPearance CLOUDY (*)    Hgb urine dipstick LARGE (*)    Protein, ur 30 (*)    Leukocytes,Ua MODERATE (*)    RBC / HPF >50 (*)    Non Squamous Epithelial 0-5 (*)    All other components within normal limits  LIPASE, BLOOD    EKG None  Radiology CT ABDOMEN PELVIS W CONTRAST  Result Date: 03/01/2021 CLINICAL DATA:  Flank pain, kidney stone suspected. EXAM: CT ABDOMEN AND PELVIS WITH CONTRAST TECHNIQUE: Multidetector CT imaging of the abdomen and pelvis was performed using the standard protocol following bolus administration of intravenous contrast. RADIATION DOSE REDUCTION: This exam was performed according to the departmental dose-optimization program which includes automated exposure control, adjustment of the mA and/or kV according to patient size and/or use of iterative reconstruction technique. CONTRAST:  OMNIPAQUE IOHEXOL 300 MG/ML  SOLN COMPARISON:  03/09/2013. FINDINGS: Lower chest: Mild atelectasis at the lung bases. Hepatobiliary: Mild fatty infiltration of the liver is noted. The gallbladder is without stones. No biliary ductal dilatation. Pancreas: Unremarkable. No pancreatic ductal dilatation or surrounding inflammatory changes. Spleen: Normal in size without focal abnormality. Adrenals/Urinary Tract: Adrenal  glands are within normal limits. The kidneys enhance symmetrically. No renal calculus is seen bilaterally. There is mild hydroureteronephrosis on the left with a 2 mm calculus at the UVJ on the left. No obstructive uropathy on the right. Mild bladder wall thickening is noted. Stomach/Bowel: Stomach is within normal limits. Appendix appears normal. No evidence of bowel wall thickening, distention, or inflammatory changes. No free air or pneumatosis. Vascular/Lymphatic: Aortic atherosclerosis. No enlarged abdominal or pelvic lymph nodes. Reproductive: Coarse calcifications are noted in the uterus, likely degenerating fibroids. No adnexal mass is identified. Other: Diastasis of the rectus abdominus with a small fat containing umbilical hernia. No abdominopelvic ascites. Musculoskeletal: Degenerative  changes in the thoracolumbar spine. No acute osseous abnormality. IMPRESSION: 1. Mild obstructive uropathy on the left with a 2 mm calculus at the UVJ on the left. 2. Mild bladder wall thickening, possible infectious or inflammatory cystitis. 3. Hepatic steatosis. Electronically Signed   By: Brett Fairy M.D.   On: 03/01/2021 23:27    Procedures Procedures    Medications Ordered in ED Medications  ondansetron (ZOFRAN-ODT) disintegrating tablet 4 mg (4 mg Oral Given 03/01/21 2128)  iohexol (OMNIPAQUE) 300 MG/ML solution 100 mL (100 mLs Intravenous Contrast Given 03/01/21 2311)  ketorolac (TORADOL) 15 MG/ML injection 15 mg (15 mg Intravenous Given 03/02/21 0125)  ondansetron (ZOFRAN) injection 4 mg (4 mg Intravenous Given 03/02/21 0213)  fentaNYL (SUBLIMAZE) injection 50 mcg (50 mcg Intravenous Given 03/02/21 0214)    ED Course/ Medical Decision Making/ A&P                           Medical Decision Making Risk Prescription drug management.   51 year old lady with left flank pain.  Basic labs reviewed, slight leukocytosis, UA with leukocytes but negative nitrates, no bacteria and few WBCs, lower suspicion  for UTI at present.  No fever or chills.  CT independently reviewed by myself.  2 mm left UVJ stone.  Suspect this is culprit for her symptoms today.  She was provided pain and nausea control and had marked improvement in her symptoms.  Given work-up today and her current symptoms, feel she can be discharged and managed in the outpatient setting.  Advised follow-up with urology, will give Rx for Flomax and pain and nausea control.  Speaks [place], utilized Art therapist throughout visit.  Patient's son at bedside, updated throughout stay.    After the discussed management above, the patient was determined to be safe for discharge.  The patient was in agreement with this plan and all questions regarding their care were answered.  ED return precautions were discussed and the patient will return to the ED with any significant worsening of condition.         Final Clinical Impression(s) / ED Diagnoses Final diagnoses:  Nephrolithiasis    Rx / DC Orders ED Discharge Orders     None         Lucrezia Starch, MD 03/02/21 [phone]

## 2021-03-02 NOTE — Discharge Instructions (Addendum)
You were diagnosed with a kidney stone today.  Please follow-up with a urologist.  For pain control recommend taking anti-inflammatory such as naproxen or Motrin.  For breakthrough pain take the prescribed Percocet as needed.  Do not drive while taking this medicine.  Take Zofran as needed for nausea.  Take the Flomax to help pass the kidney stone.  Drink plenty of fluids.  Come back to ER if you develop uncontrolled pain, any fever, vomiting or other new concerning symptom.

## 2022-03-24 ENCOUNTER — Other Ambulatory Visit: Payer: Self-pay | Admitting: Physician Assistant

## 2022-03-24 DIAGNOSIS — Z1231 Encounter for screening mammogram for malignant neoplasm of breast: Secondary | ICD-10-CM

## 2022-05-05 ENCOUNTER — Encounter: Payer: Self-pay | Admitting: Internal Medicine

## 2022-05-22 ENCOUNTER — Ambulatory Visit (AMBULATORY_SURGERY_CENTER): Payer: Medicare Other | Admitting: *Deleted

## 2022-05-22 VITALS — Ht 63.0 in | Wt 160.0 lb

## 2022-05-22 DIAGNOSIS — Z1211 Encounter for screening for malignant neoplasm of colon: Secondary | ICD-10-CM

## 2022-05-22 MED ORDER — PEG 3350-KCL-NA BICARB-NACL 420 G PO SOLR: 4000.0000 mL | Freq: Once | ORAL | 0 refills | Status: AC

## 2022-05-22 NOTE — Progress Notes (Signed)
Pt's name and DOB verified at the beginning of the pre-visit.  Pt denies any difficulty with ambulating,sitting, laying down or rolling side to side Gave both [facility] main # and MD on call # prior to instructions.  No egg or soy allergy known to patient  No issues known to pt with past sedation with any surgeries or procedures Pt denies having issues being intubated Pt has no issues moving head neck or swallowing No FH of Malignant Hyperthermia Pt is not on diet pills Pt is not on home 02  Pt is not on blood thinners  Pt has frequent issues with constipation RN instructed pt to use Miralax per bottles instructions a week before prep days. Pt states they will Pt is not on dialysis Pt denise any abnormal heart rhythms  Pt denies any upcoming cardiac testing Pt encouraged to use to use Singlecare or Goodrx to reduce cost  Patient's chart reviewed by Cathlyn Parsons CNRA prior to pre-visit and patient appropriate for the [facility].  Pre-visit completed and red dot placed by patient's name on their procedure day (on provider's schedule).  . Visit by in person Interpreter Y Hin present  to help translate and daughter who speaks English present  Pt scale weight is 160lb Instructed pt why it is important to and  to call if they have any changes in health or new medications. Directed them to the # given and on instructions.   Pt states they will.  Instructions reviewed with pt and pt states understanding. Instructed to review again prior to procedure. Pt states they will.  Instructions given to pt

## 2022-06-13 ENCOUNTER — Ambulatory Visit: Payer: Medicare Other | Admitting: Internal Medicine

## 2022-06-13 ENCOUNTER — Encounter: Payer: Self-pay | Admitting: Internal Medicine

## 2022-06-13 VITALS — BP 115/79 | HR 60 | Temp 98.5°F | Resp 12 | Ht 63.0 in | Wt 160.0 lb

## 2022-06-13 DIAGNOSIS — D123 Benign neoplasm of transverse colon: Secondary | ICD-10-CM | POA: Diagnosis not present

## 2022-06-13 DIAGNOSIS — D124 Benign neoplasm of descending colon: Secondary | ICD-10-CM | POA: Diagnosis not present

## 2022-06-13 DIAGNOSIS — D120 Benign neoplasm of cecum: Secondary | ICD-10-CM | POA: Diagnosis not present

## 2022-06-13 DIAGNOSIS — Z1211 Encounter for screening for malignant neoplasm of colon: Secondary | ICD-10-CM | POA: Diagnosis not present

## 2022-06-13 DIAGNOSIS — D122 Benign neoplasm of ascending colon: Secondary | ICD-10-CM | POA: Diagnosis not present

## 2022-06-13 HISTORY — PX: COLONOSCOPY: SHX174

## 2022-06-13 MED ORDER — SODIUM CHLORIDE 0.9 % IV SOLN: 500.0000 mL | INTRAVENOUS | Status: AC

## 2022-06-13 NOTE — Progress Notes (Signed)
Called to room to assist during endoscopic procedure.  Patient ID and intended procedure confirmed with present staff. Received instructions for my participation in the procedure from the performing physician.  

## 2022-06-13 NOTE — Patient Instructions (Addendum)
- Repeat colonoscopy in 6 months for surveillance. - Patient has a contact number available for emergencies. The signs and symptoms of potential delayed complications were discussed with the patient. Return to normal activities tomorrow. Written discharge instructions were provided to the                            patient. - Resume previous diet. - Continue present medications. - Await pathology results.   YOU HAD AN ENDOSCOPIC PROCEDURE TODAY AT THE [facility]:   Refer to the procedure report that was given to you for any specific questions about what was found during the examination.  If the procedure report does not answer your questions, please call your gastroenterologist to clarify.  If you requested that your care partner not be given the details of your procedure findings, then the procedure report has been included in a sealed envelope for you to review at your convenience later.  YOU SHOULD EXPECT: Some feelings of bloating in the abdomen. Passage of more gas than usual.  Walking can help get rid of the air that was put into your GI tract during the procedure and reduce the bloating. If you had a lower endoscopy (such as a colonoscopy or flexible sigmoidoscopy) you may notice spotting of blood in your stool or on the toilet paper. If you underwent a bowel prep for your procedure, you may not have a normal bowel movement for a few days.  Please Note:  You might notice some irritation and congestion in your nose or some drainage.  This is from the oxygen used during your procedure.  There is no need for concern and it should clear up in a day or so.  SYMPTOMS TO REPORT IMMEDIATELY:  Following lower endoscopy (colonoscopy or flexible sigmoidoscopy):  Excessive amounts of blood in the stool  Significant tenderness or worsening of abdominal pains  Swelling of the abdomen that is new, acute  Fever of 100F or higher   For urgent or emergent issues, a gastroenterologist can  be reached at any hour by calling [phone]. Do not use MyChart messaging for urgent concerns.    DIET:  We do recommend a small meal at first, but then you may proceed to your regular diet.  Drink plenty of fluids but you should avoid alcoholic beverages for 24 hours.  MEDICATIONS: Continue present medications.  Please see handouts given to you by your recovery nurse: Polyps.  FOLLOW UP: Await pathology results.  Thank you for allowing [place] to provide for your healthcare needs today.  ACTIVITY:  You should plan to take it easy for the rest of today and you should NOT DRIVE or use heavy machinery until tomorrow (because of the sedation medicines used during the test).    FOLLOW UP: Our staff will call the number listed on your records the next business day following your procedure.  We will call around 7:15- 8:00 am to check on you and address any questions or concerns that you may have regarding the information given to you following your procedure. If we do not reach you, we will leave a message.     If any biopsies were taken you will be contacted by phone or by letter within the next 1-3 weeks.  Please call us at [phone] if you have not heard about the biopsies in 3 weeks.    SIGNATURES/CONFIDENTIALITY: You and/or your care partner have signed paperwork which will  be entered into your electronic medical record.  These signatures attest to the fact that that the information above on your After Visit Summary has been reviewed and is understood.  Full responsibility of the confidentiality of this discharge information lies with you and/or your care-partner.

## 2022-06-13 NOTE — Op Note (Addendum)
[facility] Patient Name: Renee Mccann Procedure Date: 06/13/2022 11:22 AM MRN: [id number] Endoscopist: Wilhemina Bonito. Marina Goodell , MD, [id number] Age: 52 Referring MD:  Date of Birth: 1970/04/09 Gender: Female Account #: [id number] Procedure:                Colonoscopy w cold snare polypectomy x 5; hot x                            1;EMR x 1;bx x 1; subm injct x 1 Indications:              Screening for colorectal malignant neoplasm Medicines:                Monitored Anesthesia Care Procedure:                Pre-Anesthesia Assessment:                           - Prior to the procedure, a History and Physical                            was performed, and patient medications and                            allergies were reviewed. The patient's tolerance of                            previous anesthesia was also reviewed. The risks                            and benefits of the procedure and the sedation                            options and risks were discussed with the patient.                            All questions were answered, and informed consent                            was obtained. Prior Anticoagulants: The patient has                            taken no anticoagulant or antiplatelet agents. ASA                            Grade Assessment: II - A patient with mild systemic                            disease. After reviewing the risks and benefits,                            the patient was deemed in satisfactory condition to                            undergo the procedure.  After obtaining informed consent, the colonoscope                            was passed under direct vision. Throughout the                            procedure, the patient's blood pressure, pulse, and                            oxygen saturations were monitored continuously. The                            CF [id number] #[id number] was introduced through the anus                             and advanced to the the cecum, identified by                            appendiceal orifice and ileocecal valve. The                            ileocecal valve, appendiceal orifice, and rectum                            were photographed. The quality of the bowel                            preparation was excellent. The colonoscopy was                            performed without difficulty. The patient tolerated                            the procedure well. The bowel preparation used was                            SUPREP via split dose instruction. Scope In: 11:26:37 AM Scope Out: 11:52:25 AM Scope Withdrawal Time: 0 hours 23 minutes 30 seconds  Total Procedure Duration: 0 hours 25 minutes 48 seconds  Findings:                 Five polyps were found in the ascending colon and                            cecum. The polyps were 3 to 7 mm in size. These                            polyps were removed with a cold snare. Resection                            and retrieval were complete.                           A 1 mm polyp was  found in the transverse colon. The                            polyp was removed with a jumbo cold forceps.                            Resection and retrieval were complete.                           A 10 mm polyp was found in the descending colon.                            The polyp was pedunculated. The polyp was removed                            with a hot snare. Resection and retrieval were                            complete.                           A 20 mm polyp was found in the ascending colon. The                            polyp was sessile. The polyp was removed with an                            EMR technique?saline injection-lift technique using                            a cold snare (piecemeal). Resection and retrieval                            were complete. The edges were cauterized using                            snare tip with soft  cautery. The area was tattooed                            with an injection of 4 mL of Spot (carbon black).                            See images.                           The exam was otherwise without abnormality on                            direct and retroflexion views. Complications:            No immediate complications. Estimated blood loss:                            None. Estimated Blood Loss:  Estimated blood loss: none. Impression:               - Five 3 to 7 mm polyps in the ascending colon and                            in the cecum, removed with a cold snare. Resected                            and retrieved.                           - One 1 mm polyp in the transverse colon, removed                            with a jumbo cold forceps. Resected and retrieved.                           - One 10 mm polyp in the descending colon, removed                            with a hot snare. Resected and retrieved.                           - One 20 mm polyp in the ascending colon, removed                            using injection-lift and a cold snare. EMR                            technique. Resected and retrieved. Tattooed.                           - The examination was otherwise normal on direct                            and retroflexion views. Recommendation:           - Repeat colonoscopy in 6 months for surveillance.                           - Patient has a contact number available for                            emergencies. The signs and symptoms of potential                            delayed complications were discussed with the                            patient. Return to normal activities tomorrow.                            Written discharge instructions were provided to the  patient.                           - Resume previous diet.                           - Continue present medications.                           - Await pathology  results. Wilhemina Bonito. Marina Goodell, MD 06/13/2022 12:11:52 PM This report has been signed electronically.

## 2022-06-13 NOTE — Progress Notes (Signed)
HISTORY OF PRESENT ILLNESS:  Renee Mccann is a 52 y.o. female who presents today for routine screening colonoscopy.  No complaints  REVIEW OF SYSTEMS:  All non-GI ROS negative except for  Past Medical History:  Diagnosis Date   Arthritis    Chronic back pain    Diabetes mellitus without complication (HCC)    Type II   Hyperlipidemia    Hypertension    Hypokalemia    Kidney stones    Pyelonephritis     History reviewed. No pertinent surgical history.  Social History Renee Mccann  reports that she quit smoking about 3 years ago. Her smoking use included cigarettes. She has never used smokeless tobacco. She reports that she does not drink alcohol and does not use drugs.  family history is not on file.  Allergies  Allergen Reactions   Other Itching    Patient is allergic to something and cant remember what it is.It was for pain.       PHYSICAL EXAMINATION: Vital signs: BP 114/70   Pulse 70   Temp 98.5 F (36.9 C) (Skin)   Ht 5\' 3"  (1.6 m)   Wt 160 lb (72.6 kg)   SpO2 97%   BMI 28.34 kg/m  General: Well-developed, well-nourished, no acute distress HEENT: Sclerae are anicteric, conjunctiva pink. Oral mucosa intact Lungs: Clear Heart: Regular Abdomen: soft, nontender, nondistended, no obvious ascites, no peritoneal signs, normal bowel sounds. No organomegaly. Extremities: No edema Psychiatric: alert and oriented x3. Cooperative     ASSESSMENT:   Colon cancer screening  PLAN:  Screening colonoscopy

## 2022-06-13 NOTE — Progress Notes (Signed)
Report to PACU, RN, vss, BBS= Clear.  

## 2022-06-13 NOTE — Progress Notes (Signed)
Patient states there have been no changes to medical or surgical history since time of pre-visit. 

## 2022-06-13 NOTE — Progress Notes (Signed)
Interpreter used today at the [facility] for this pt.  Interpreter's name Louretta Parma who was present throughout recovery and discharge process.

## 2022-06-16 ENCOUNTER — Telehealth: Payer: Self-pay

## 2022-06-16 NOTE — Telephone Encounter (Signed)
Left message on answering machine. 

## 2022-06-17 ENCOUNTER — Encounter: Payer: Self-pay | Admitting: Internal Medicine

## 2022-11-18 ENCOUNTER — Encounter: Payer: Self-pay | Admitting: Internal Medicine

## 2023-06-02 ENCOUNTER — Emergency Department (HOSPITAL_BASED_OUTPATIENT_CLINIC_OR_DEPARTMENT_OTHER)
Admission: EM | Admit: 2023-06-02 | Discharge: 2023-06-02 | Disposition: A | Discharge status: Home / Self Care | Attending: Emergency Medicine | Admitting: Emergency Medicine

## 2023-06-02 ENCOUNTER — Other Ambulatory Visit: Payer: Self-pay

## 2023-06-02 ENCOUNTER — Encounter (HOSPITAL_BASED_OUTPATIENT_CLINIC_OR_DEPARTMENT_OTHER): Payer: Self-pay

## 2023-06-02 ENCOUNTER — Emergency Department (HOSPITAL_BASED_OUTPATIENT_CLINIC_OR_DEPARTMENT_OTHER)

## 2023-06-02 DIAGNOSIS — R109 Unspecified abdominal pain: Secondary | ICD-10-CM | POA: Diagnosis not present

## 2023-06-02 DIAGNOSIS — E1165 Type 2 diabetes mellitus with hyperglycemia: Secondary | ICD-10-CM | POA: Insufficient documentation

## 2023-06-02 DIAGNOSIS — I10 Essential (primary) hypertension: Secondary | ICD-10-CM | POA: Diagnosis not present

## 2023-06-02 DIAGNOSIS — Z794 Long term (current) use of insulin: Secondary | ICD-10-CM | POA: Insufficient documentation

## 2023-06-02 DIAGNOSIS — Z79899 Other long term (current) drug therapy: Secondary | ICD-10-CM | POA: Diagnosis not present

## 2023-06-02 DIAGNOSIS — I700 Atherosclerosis of aorta: Secondary | ICD-10-CM | POA: Insufficient documentation

## 2023-06-02 DIAGNOSIS — R739 Hyperglycemia, unspecified: Secondary | ICD-10-CM | POA: Diagnosis present

## 2023-06-02 LAB — URINALYSIS, ROUTINE W REFLEX MICROSCOPIC
Bilirubin Urine: NEGATIVE
Glucose, UA: >=500 mg/dL — AB
Hgb urine dipstick: NEGATIVE
Ketones, ur: NEGATIVE mg/dL
Leukocytes,Ua: NEGATIVE
Nitrite: NEGATIVE
Protein, ur: NEGATIVE mg/dL
Specific Gravity, Urine: 1.01 (ref 1.005–1.030)
pH: 6 (ref 5.0–8.0)

## 2023-06-02 LAB — URINALYSIS, MICROSCOPIC (REFLEX): WBC, UA: NONE SEEN WBC/hpf (ref 0–5)

## 2023-06-02 LAB — I-STAT VENOUS BLOOD GAS, ED
Acid-Base Excess: 0 mmol/L (ref 0.0–2.0)
Bicarbonate: 23.7 mmol/L (ref 20.0–28.0)
Calcium, Ion: 1.17 mmol/L (ref 1.15–1.40)
HCT: 45 % (ref 36.0–46.0)
Hemoglobin: 15.3 g/dL — ABNORMAL HIGH (ref 12.0–15.0)
O2 Saturation: 97 %
Patient temperature: 98.2
Potassium: 3.2 mmol/L — ABNORMAL LOW (ref 3.5–5.1)
Sodium: 137 mmol/L (ref 135–145)
TCO2: 25 mmol/L (ref 22–32)
pCO2, Ven: 33.7 mmHg — ABNORMAL LOW (ref 44–60)
pH, Ven: 7.454 — ABNORMAL HIGH (ref 7.25–7.43)
pO2, Ven: 89 mmHg — ABNORMAL HIGH (ref 32–45)

## 2023-06-02 LAB — BASIC METABOLIC PANEL WITH GFR
Anion gap: 14 (ref 5–15)
BUN: 18 mg/dL (ref 6–20)
CO2: 24 mmol/L (ref 22–32)
Calcium: 9.4 mg/dL (ref 8.9–10.3)
Chloride: 99 mmol/L (ref 98–111)
Creatinine, Ser: 1.11 mg/dL — ABNORMAL HIGH (ref 0.44–1.00)
GFR, Estimated: 60 mL/min — ABNORMAL LOW (ref >60)
Glucose, Bld: 318 mg/dL — ABNORMAL HIGH (ref 70–99)
Potassium: 3.3 mmol/L — ABNORMAL LOW (ref 3.5–5.1)
Sodium: 137 mmol/L (ref 135–145)

## 2023-06-02 LAB — CBG MONITORING, ED
Glucose-Capillary: 329 mg/dL — ABNORMAL HIGH (ref 70–99)
Glucose-Capillary: 311 mg/dL — ABNORMAL HIGH (ref 70–99)
Glucose-Capillary: 225 mg/dL — ABNORMAL HIGH (ref 70–99)
Glucose-Capillary: 220 mg/dL — ABNORMAL HIGH (ref 70–99)

## 2023-06-02 LAB — CBC
HCT: 44.6 % (ref 36.0–46.0)
Hemoglobin: 14.4 g/dL (ref 12.0–15.0)
MCH: 27.1 pg (ref 26.0–34.0)
MCHC: 32.3 g/dL (ref 30.0–36.0)
MCV: 83.8 fL (ref 80.0–100.0)
Platelets: 216 10*3/uL (ref 150–400)
RBC: 5.32 MIL/uL — ABNORMAL HIGH (ref 3.87–5.11)
RDW: 13.3 % (ref 11.5–15.5)
WBC: 7.8 10*3/uL (ref 4.0–10.5)
nRBC: 0 % (ref 0.0–0.2)

## 2023-06-02 LAB — BETA-HYDROXYBUTYRIC ACID: Beta-Hydroxybutyric Acid: 0.15 mmol/L (ref 0.05–0.27)

## 2023-06-02 MED ORDER — LACTATED RINGERS IV BOLUS
1000.0000 mL | Freq: Once | INTRAVENOUS | Status: AC
  Administered 2023-06-02: 1000 mL via INTRAVENOUS

## 2023-06-02 NOTE — ED Provider Notes (Signed)
 Renee Mccann Provider Note   CSN: [id number] Arrival date & time: 06/02/23  1617     History  Chief Complaint  Patient presents with   Hyperglycemia    Renee Mccann is a 53 y.o. female.   Hyperglycemia    53 year old female with medical history significant for hypertension, diabetes mellitus on insulin , pyelonephritis, nephrolithiasis, HLD, chronic back pain and arthritis presenting to the emergency department with elevated blood sugars at her PCP.  History is provided with the assistance of a [place] language video interpreter.  Family also bedside.  The patient has had polyuria and polydipsia despite her compliance with her home insulin  regimen.  She denies any infectious symptoms.  No dysuria, no abdominal pain, nausea, vomiting.  No cough, fevers or chills.  She endorses chronic bilateral flank pain associated with her chronic back pain, no specific change in caliber, slightly worsening today.  Her granddaughter wonders if she could have a new kidney stone. No recent falls or trauma, no numbness or weakness of the lower extremities.   Home Medications Prior to Admission medications   Medication Sig Start Date End Date Taking? Authorizing Provider  acetaminophen  (TYLENOL ) 500 MG tablet Take 1,000 mg by mouth every 6 (six) hours as needed for moderate pain.    [provider]  amLODipine (NORVASC) 10 MG tablet Take 10 mg by mouth daily.    [provider]  atorvastatin (LIPITOR) 80 MG tablet 1 tab(s) orally once a day for 90 days 05/01/22 10/28/22  [provider]  dicyclomine  (BENTYL ) 20 MG tablet Take 1 tablet (20 mg total) by mouth 2 (two) times daily as needed for spasms (abdominal pain). 03/12/17   Scarlette Currier, MD  empagliflozin (JARDIANCE) 25 MG TABS tablet Take 25 mg by mouth daily.    [provider]  gabapentin (NEURONTIN) 300 MG capsule Take 300 mg by mouth at bedtime.    [provider]  glipiZIDE (GLIPIZIDE XL) 10 MG 24 hr tablet TAKE 1 TABLET BY MOUTH EVERY DAY for 30    [provider]  Insulin  Glargine (BASAGLAR KWIKPEN) 100 UNIT/ML 25 UNITS subcutaneously AT BEDTIME 05/01/22   [provider]  LEVEMIR FLEXPEN 100 UNIT/ML FlexPen SMARTSIG:25 Unit(s) SUB-Q Twice Daily 12/18/21   [provider]  lisinopril-hydrochlorothiazide (PRINZIDE,ZESTORETIC) 20-12.5 MG tablet Take 2 tablets by mouth daily.    [provider]  metFORMIN (GLUCOPHAGE) 500 MG tablet 0.5 TABLET ONCE DAILY    [provider]  naproxen (EC NAPROSYN) 500 MG EC tablet Take 500 mg by mouth 2 (two) times daily with a meal.    [provider]  nortriptyline (PAMELOR) 25 MG capsule Take 25 mg by mouth at bedtime.    [provider]  ondansetron  (ZOFRAN  ODT) 4 MG disintegrating tablet Take 1 tablet (4 mg total) by mouth every 8 (eight) hours as needed for nausea or vomiting. Patient not taking: Reported on 12/18/2017 03/12/17   Scarlette Currier, MD  pravastatin (PRAVACHOL) 20 MG tablet Take 20 mg by mouth at bedtime. 10/09/15   [provider]      Allergies    Other    Review of Systems   Review of Systems  All other systems reviewed and are negative.   Physical Exam Updated Vital Signs BP 107/69   Pulse 68   Temp 98.2 F (36.8 C)   Resp 17   SpO2 97%  Physical Exam Vitals and nursing note reviewed.  Constitutional:  General: She is not in acute distress.    Appearance: She is well-developed.  HENT:     Head: Normocephalic and atraumatic.  Eyes:     Conjunctiva/sclera: Conjunctivae normal.  Cardiovascular:     Rate and Rhythm: Normal rate and regular rhythm.     Pulses: Normal pulses.     Heart sounds: No murmur heard. Pulmonary:     Effort: Pulmonary effort is normal. No respiratory distress.     Breath sounds: Normal breath sounds.  Abdominal:     Palpations: Abdomen is soft.     Tenderness: There is no  abdominal tenderness. There is right CVA tenderness and left CVA tenderness.  Musculoskeletal:        General: No swelling.     Cervical back: Neck supple.     Comments: No midline TTP of the cervical thoracic or lumbar spine  Skin:    General: Skin is warm and dry.     Capillary Refill: Capillary refill takes less than 2 seconds.  Neurological:     Mental Status: She is alert.  Psychiatric:        Mood and Affect: Mood normal.     ED Results / Procedures / Treatments   Labs (all labs ordered are listed, but only abnormal results are displayed) Labs Reviewed  BASIC METABOLIC PANEL WITH GFR - Abnormal; Notable for the following components:      Result Value   Potassium 3.3 (*)    Glucose, Bld 318 (*)    Creatinine, Ser 1.11 (*)    GFR, Estimated 60 (*)    All other components within normal limits  CBC - Abnormal; Notable for the following components:   RBC 5.32 (*)    All other components within normal limits  URINALYSIS, ROUTINE W REFLEX MICROSCOPIC - Abnormal; Notable for the following components:   Glucose, UA >=500 (*)    All other components within normal limits  URINALYSIS, MICROSCOPIC (REFLEX) - Abnormal; Notable for the following components:   Bacteria, UA RARE (*)    All other components within normal limits  CBG MONITORING, ED - Abnormal; Notable for the following components:   Glucose-Capillary 329 (*)    All other components within normal limits  I-STAT VENOUS BLOOD GAS, ED - Abnormal; Notable for the following components:   pH, Ven 7.454 (*)    pCO2, Ven 33.7 (*)    pO2, Ven 89 (*)    Potassium 3.2 (*)    Hemoglobin 15.3 (*)    All other components within normal limits  CBG MONITORING, ED - Abnormal; Notable for the following components:   Glucose-Capillary 311 (*)    All other components within normal limits  CBG MONITORING, ED - Abnormal; Notable for the following components:   Glucose-Capillary 225 (*)    All other components within normal limits  CBG  MONITORING, ED - Abnormal; Notable for the following components:   Glucose-Capillary 220 (*)    All other components within normal limits  BETA-HYDROXYBUTYRIC ACID    EKG EKG Interpretation Date/Time:  Tuesday Jun 02 2023 16:37:03 EDT Ventricular Rate:  76 PR Interval:  180 QRS Duration:  82 QT Interval:  397 QTC Calculation: 447 R Axis:   -70  Text Interpretation: Sinus rhythm Consider right atrial enlargement Inferior infarct, old Consider anterior infarct Confirmed by Rosealee Concha ([id number]) on 06/02/2023 4:37:45 PM  Radiology CT Renal Stone Study Result Date: 06/02/2023 CLINICAL DATA:  Abdominal/flank pain, stone suspected. EXAM: CT ABDOMEN AND PELVIS WITHOUT CONTRAST  TECHNIQUE: Multidetector CT imaging of the abdomen and pelvis was performed following the standard protocol without IV contrast. RADIATION DOSE REDUCTION: This exam was performed according to the departmental dose-optimization program which includes automated exposure control, adjustment of the mA and/or kV according to patient size and/or use of iterative reconstruction technique. COMPARISON:  CT scan abdomen and pelvis from 03/01/2021. FINDINGS: Lower chest: There are subpleural atelectatic changes in the visualized lung bases. No overt consolidation. No pleural effusion. The heart is normal in size. No pericardial effusion. Hepatobiliary: The liver is normal in size. Non-cirrhotic configuration. No suspicious mass. No intrahepatic or extrahepatic bile duct dilation. Contracted gallbladder. No calcified gallstones. Normal gallbladder wall thickness. No pericholecystic inflammatory changes. Pancreas: Unremarkable. No pancreatic ductal dilatation or surrounding inflammatory changes. Spleen: Within normal limits. No focal lesion. Adrenals/Urinary Tract: Adrenal glands are unremarkable. No suspicious renal mass within the limitations of this unenhanced exam. No nephroureterolithiasis or obstructive uropathy. Unremarkable urinary  bladder. Stomach/Bowel: No disproportionate dilation of the small or large bowel loops. No evidence of abnormal bowel wall thickening or inflammatory changes. The appendix is unremarkable. Vascular/Lymphatic: No ascites or pneumoperitoneum. No abdominal or pelvic lymphadenopathy, by size criteria. No aneurysmal dilation of the major abdominal arteries. There are mild peripheral atherosclerotic vascular calcifications of the aorta and its major branches. Reproductive: Not well evaluated on the CT scan exam. However, note is made of normal-size anteverted uterus containing multiple calcified leiomyomas. No large adnexal mass seen. Other: There is a tiny fat containing umbilical hernia. The soft tissues and abdominal wall are otherwise unremarkable. Musculoskeletal: No suspicious osseous lesions. There are mild multilevel degenerative changes in the visualized spine. IMPRESSION: 1. No nephroureterolithiasis or obstructive uropathy. 2. No acute inflammatory process identified within the abdomen or pelvis. 3. Multiple other nonacute observations, as described above. Aortic Atherosclerosis (ICD10-I70.0). Electronically Signed   By: Beula Brunswick M.D.   On: 06/02/2023 18:27    Procedures Procedures    Medications Ordered in ED Medications  lactated ringers bolus 1,000 mL (0 mLs Intravenous Stopped 06/02/23 1845)    ED Course/ Medical Decision Making/ A&P                                 Medical Decision Making Amount and/or Complexity of Data Reviewed Labs: ordered. Radiology: ordered.    53 year old female with medical history significant for hypertension, diabetes mellitus on insulin , pyelonephritis, nephrolithiasis, HLD, chronic back pain and arthritis presenting to the emergency department with elevated blood sugars at her PCP.  History is provided with the assistance of a [place] language video interpreter.  Family also bedside.  The patient has had polyuria and polydipsia despite her  compliance with her home insulin  regimen.  She denies any infectious symptoms.  No dysuria, no abdominal pain, nausea, vomiting.  No cough, fevers or chills.  She endorses chronic bilateral flank pain associated with her chronic back pain, no specific change in caliber, slightly worsening today.  Her granddaughter wonders if she could have a new kidney stone. No recent falls or trauma, no numbness or weakness of the lower extremities.   On arrival, the patient was afebrile, not tachycardic, BP 113/73, not tachypneic, saturating 99% on room air.  Patient had bilateral CVA tenderness on exam, seems to be more musculoskeletal in etiology, given the patient's history of nephrolithiasis, will evaluate further to rule out nephrolithiasis, pyelonephritis etc.  Initial CBG elevated at 329, repeat CBGs downtrending to 225  after fluid resuscitation.  Labs: VBG without an acidosis, pH 7.45, pCO2 34, HCO3 23.7, CBC without a leukocytosis or anemia, BMP without an anion gap acidosis, bicarbonate 24, anion gap 14, creatinine 1.1, potassium mildly low at 3.3, urinalysis unconvincing for urinary tract infection, no hematuria.  CT Stone:  IMPRESSION:  1. No nephroureterolithiasis or obstructive uropathy.  2. No acute inflammatory process identified within the abdomen or  pelvis.  3. Multiple other nonacute observations, as described above.    Aortic Atherosclerosis (ICD10-I70.0).   Blood glucoses appropriately downtrending with fluid resuscitation, patient not in DKA, no concern for HHS, overall reassuring workup at this time.  Patient advised to follow-up outpatient with her PCP for continued management of her diabetes.  Stable for discharge.   Final Clinical Impression(s) / ED Diagnoses Final diagnoses:  Hyperglycemia due to diabetes mellitus [facility])    Rx / DC Orders ED Discharge Orders     None         Rosealee Concha, MD 06/02/23 [phone]

## 2023-06-02 NOTE — ED Triage Notes (Signed)
 Pt was sent in by doctor for a blood sugar of 424, she has been having complaints of dizziness especially with position changes, as well has headaches and migraines. She is diabetic  as well.

## 2023-06-02 NOTE — Discharge Instructions (Addendum)
 These follow-up with your primary care provider regarding further management of your diabetes mellitus.  Your blood glucose was appropriately downtrending in the emergency department and you are not in diabetic ketoacidosis.  The remainder of your workup to include laboratory evaluation and CT imaging was overall reassuring.

## 2023-06-11 ENCOUNTER — Other Ambulatory Visit: Payer: Self-pay | Admitting: Physician Assistant

## 2023-06-11 DIAGNOSIS — Z1231 Encounter for screening mammogram for malignant neoplasm of breast: Secondary | ICD-10-CM

## 2023-06-23 ENCOUNTER — Encounter: Payer: Self-pay | Admitting: Internal Medicine

## 2023-07-03 NOTE — Therapy (Signed)
 OUTPATIENT PHYSICAL THERAPY CERVICAL EVALUATION   Patient Name: Renee Mccann MRN: [id number] DOB:November 11, 1970, 53 y.o., female Today's Date: 07/03/2023  END OF SESSION:   Past Medical History:  Diagnosis Date   Arthritis    Chronic back pain    Diabetes mellitus without complication (HCC)    Type II   Hyperlipidemia    Hypertension    Hypokalemia    Kidney stones    Pyelonephritis    Past Surgical History:  Procedure Laterality Date   COLONOSCOPY  06/13/2022   Legrand Puma at [facility], polyps   Patient Active Problem List   Diagnosis Date Noted   S/P appendectomy 02/08/2013   HTN (hypertension) 02/08/2013    PCP: Dianah Fort, PA  REFERRING PROVIDER: Dianah Fort, PA  REFERRING DIAG: CHRONIC NECK PAIN  THERAPY DIAG:  No diagnosis found.  Rationale for Evaluation and Treatment: Rehabilitation  ONSET DATE: ***  SUBJECTIVE:                                                                                                                                                                                                         SUBJECTIVE STATEMENT: *** Hand dominance: {MISC; OT HAND DOMINANCE:[phone]}  PERTINENT HISTORY:  DM2, HTN  PAIN:  Are you having pain: *** Location/description: *** Best-worst over past week: ***  - aggravating factors: *** - Easing factors: ***    PRECAUTIONS: none  RED FLAGS: {PT Red Flags:29287}     WEIGHT BEARING RESTRICTIONS: No  FALLS:  Has patient fallen in last 6 months? {fallsyesno:27318}  LIVING ENVIRONMENT: Lives with: {[facility] lives with:25569::lives with their family} Lives in: {Lives in:25570} Stairs: {opstairs:27293} Has following equipment at home: {Assistive devices:23999}  OCCUPATION: ***  PLOF: {PLOF:24004}  PATIENT GOALS: ***  NEXT MD VISIT: ***  OBJECTIVE:  Note: Objective measures were completed at Evaluation unless otherwise noted.  DIAGNOSTIC FINDINGS:  No recent imaging in  chart  PATIENT SURVEYS:  NDI:   COGNITION: Overall cognitive status: {cognition:24006}  SENSATION: {sensation:27233}  POSTURE: {posture:25561}  PALPATION: ***   CERVICAL ROM:   {AROM/PROM:27142} ROM A/PROM (deg) eval  Flexion   Extension   Right lateral flexion   Left lateral flexion   Right rotation   Left rotation    (Blank rows = not tested) (Key: WFL = within functional limits not formally assessed, * = concordant pain, s = stiffness/stretching sensation, NT = not tested) Comment:   UPPER EXTREMITY ROM:  A/PROM Right eval Left eval  Shoulder flexion    Shoulder abduction    Shoulder internal  rotation    Shoulder external rotation    Elbow flexion    Elbow extension    Wrist flexion    Wrist extension     (Blank rows = not tested) (Key: WFL = within functional limits not formally assessed, * = concordant pain, s = stiffness/stretching sensation, NT = not tested)  Comments:    UPPER EXTREMITY MMT:  MMT Right eval Left eval  Shoulder flexion    Shoulder extension    Shoulder abduction    Shoulder extension    Shoulder internal rotation    Shoulder external rotation    Elbow flexion    Elbow extension    Grip strength    (Blank rows = not tested)  (Key: WFL = within functional limits not formally assessed, * = concordant pain, s = stiffness/stretching sensation, NT = not tested)  Comments:   CERVICAL SPECIAL TESTS:  {Cervical special tests:25246}  FUNCTIONAL TESTS:  {Functional tests:24029}  TREATMENT DATE:  [facility] Adult PT Treatment:                                                DATE: 07/06/23 Therapeutic Exercise: *** Manual Therapy: *** Neuromuscular re-ed: *** Therapeutic Activity: *** Modalities: *** Self Care: ***                                                                                                                                  PATIENT EDUCATION:  Education details: Pt education on PT impairments, prognosis, and  POC. Informed consent. Rationale for interventions, safe/appropriate HEP performance Person educated: Patient Education method: Explanation, Demonstration, Tactile cues, Verbal cues Education comprehension: verbalized understanding, returned demonstration, verbal cues required, tactile cues required, and needs further education    HOME EXERCISE PROGRAM: ***  ASSESSMENT:  CLINICAL IMPRESSION: Patient is a 53 y.o. woman who was seen today for physical therapy evaluation and treatment for neck pain. ***    OBJECTIVE IMPAIRMENTS: {opptimpairments:25111}.   ACTIVITY LIMITATIONS: {activitylimitations:27494}  PARTICIPATION LIMITATIONS: {participationrestrictions:25113}  PERSONAL FACTORS: Time since onset of injury/illness/exacerbation and 1-2 comorbidities: DM2, HTN are also affecting patient's functional outcome.   REHAB POTENTIAL: {rehabpotential:25112}  CLINICAL DECISION MAKING: {clinical decision making:25114}  EVALUATION COMPLEXITY: {Evaluation complexity:25115}   GOALS: Goals reviewed with patient? {yes/no:20286}  SHORT TERM GOALS: Target date: ***  Pt will demonstrate appropriate understanding and performance of initially prescribed HEP in order to facilitate improved independence with management of symptoms.  Baseline: HEP ***  Goal status: INITIAL   2. Pt will report at least 25% improvement in overall pain levels over past week in order to facilitate improved tolerance to typical daily activities.   Baseline: ***  Goal status: INITIAL    LONG TERM GOALS: Target date: ***  Pt will score less than or equal to *** on NDI in  order to demonstrate improved perception of function due to symptoms (MDC 10-13 pts per Adline Hook al 2009, 2010). Baseline: *** Goal status: INITIAL  2. Pt will demonstrate at least *** degrees of active cervical rotation ROM in order to demonstrate improved environmental awareness and safety with driving.  Baseline: see ROM chart above Goal status:  INITIAL  3. Pt will demonstrate at least 4+/5 shoulder MMT for improved symmetry of UE strength and improved tolerance to functional movements.  Baseline: see MMT chart above Goal status: INITIAL   4. Pt will report/demonstrate ability to *** with less than 2 point increase on NPS in order to demonstrate improved tolerance to functional activities such as ***. Baseline: *** Goal status: INITIAL    PLAN:  PT FREQUENCY: {rehab frequency:25116}  PT DURATION: {rehab duration:25117}  PLANNED INTERVENTIONS: {rehab planned interventions:25118::97110-Therapeutic exercises,97530- Therapeutic [phone]- Neuromuscular re-education,97535- Self JXBJ,47829- Manual therapy}  PLAN FOR NEXT SESSION: Review/update HEP PRN. Work on [employer] exercises as appropriate with emphasis on ***. Symptom modification strategies as indicated/appropriate.    Lovett Ruck PT, DPT 07/03/2023 12:15 PM

## 2023-07-06 ENCOUNTER — Encounter: Payer: Self-pay | Admitting: Physical Therapy

## 2023-07-06 ENCOUNTER — Other Ambulatory Visit: Payer: Self-pay

## 2023-07-06 ENCOUNTER — Ambulatory Visit: Attending: Physician Assistant | Admitting: Physical Therapy

## 2023-07-06 DIAGNOSIS — R293 Abnormal posture: Secondary | ICD-10-CM | POA: Insufficient documentation

## 2023-07-06 DIAGNOSIS — M542 Cervicalgia: Secondary | ICD-10-CM | POA: Diagnosis present

## 2023-07-15 ENCOUNTER — Ambulatory Visit: Attending: Physician Assistant

## 2023-07-15 DIAGNOSIS — R293 Abnormal posture: Secondary | ICD-10-CM | POA: Diagnosis present

## 2023-07-15 DIAGNOSIS — M542 Cervicalgia: Secondary | ICD-10-CM | POA: Diagnosis present
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
## 2023-07-21 ENCOUNTER — Ambulatory Visit

## 2023-08-03 ENCOUNTER — Other Ambulatory Visit (HOSPITAL_COMMUNITY)
Admission: RE | Admit: 2023-08-03 | Discharge: 2023-08-03 | Disposition: A | Discharge status: Home / Self Care | Source: Ambulatory Visit | Attending: Obstetrics and Gynecology | Admitting: Obstetrics and Gynecology

## 2023-08-03 ENCOUNTER — Ambulatory Visit: Admitting: Obstetrics and Gynecology

## 2023-08-03 ENCOUNTER — Encounter: Payer: Self-pay | Admitting: Obstetrics and Gynecology

## 2023-08-03 VITALS — BP 122/75 | HR 75 | Ht 62.0 in | Wt 154.0 lb

## 2023-08-03 DIAGNOSIS — Z1331 Encounter for screening for depression: Secondary | ICD-10-CM | POA: Diagnosis not present

## 2023-08-03 DIAGNOSIS — Z1151 Encounter for screening for human papillomavirus (HPV): Secondary | ICD-10-CM | POA: Insufficient documentation

## 2023-08-03 DIAGNOSIS — Z01419 Encounter for gynecological examination (general) (routine) without abnormal findings: Secondary | ICD-10-CM

## 2023-08-03 NOTE — Progress Notes (Signed)
 Subjective:     Renee Mccann is a 53 y.o. female postmenopausal with BMI 28 who is here for a comprehensive physical exam. The patient reports no problems. She has been postmenopausal for at least 5 years and denies any episodes of postmenopausal vaginal bleeding. She is not sexually active. She denies pelvic pain or abnormal discharge. She denies urinary incontinence and admits to some constipation. Patient is without any complaints  Past Medical History:  Diagnosis Date   Arthritis    Chronic back pain    Diabetes mellitus without complication (HCC)    Type II   Hyperlipidemia    Hypertension    Hypokalemia    Kidney stones    Pyelonephritis    Past Surgical History:  Procedure Laterality Date   COLONOSCOPY  06/13/2022   Norleen Kiang at [facility], polyps   Family History  Problem Relation Age of Onset   Colon cancer Neg Hx    Colon polyps Neg Hx    Esophageal cancer Neg Hx    Rectal cancer Neg Hx    Stomach cancer Neg Hx     Social History   Socioeconomic History   Marital status: Married    Spouse name: Not on file   Number of children: 3   Years of education: Not on file   Highest education level: Not on file  Occupational History   Occupation: Research officer, trade union: UNEMPLOYED  Tobacco Use   Smoking status: Former    Current packs/day: 0.00    Types: Cigarettes    Quit date: 2021    Years since quitting: 4.5   Smokeless tobacco: Never  Vaping Use   Vaping status: Never Used  Substance and Sexual Activity   Alcohol use: No   Drug use: No   Sexual activity: Not on file  Other Topics Concern   Not on file  Social History Narrative   Not on file   Social Drivers of Health   Financial Resource Strain: Not on file  Food Insecurity: Not on file  Transportation Needs: Not on file  Physical Activity: Not on file  Stress: Not on file  Social Connections: Unknown (05/24/2021)   Received from [facility]   Social Network    Social Network: Not on file   Intimate Partner Violence: Unknown (04/16/2021)   Received from [facility]   HITS    Physically Hurt: Not on file    Insult or Talk Down To: Not on file    Threaten Physical Harm: Not on file    Scream or Curse: Not on file   Health Maintenance  Topic Date Due   Medicare Annual Wellness (AWV)  Never done   HIV Screening  Never done   Diabetic kidney evaluation - Urine ACR  Never done   Hepatitis C Screening  Never done   DTaP/Tdap/Td (1 - Tdap) Never done   Hepatitis B Vaccines (1 of 3 - 19+ 3-dose series) Never done   Cervical Cancer Screening (HPV/Pap Cotest)  Never done   MAMMOGRAM  Never done   Zoster Vaccines- Shingrix (1 of 2) Never done   COVID-19 Vaccine (1 - 2024-25 season) Never done   Colonoscopy  06/13/2023   INFLUENZA VACCINE  08/14/2023   Diabetic kidney evaluation - eGFR measurement  06/01/2024   HPV VACCINES  Aged Out   Meningococcal B Vaccine  Aged Out       Review of Systems Pertinent items noted in HPI and remainder of  comprehensive ROS otherwise negative.   Objective:  Blood pressure 122/75, pulse 75, height 5' 2 (1.575 m), weight 154 lb (69.9 kg).   GENERAL: Well-developed, well-nourished female in no acute distress.  HEENT: Normocephalic, atraumatic. Sclerae anicteric.  NECK: Supple. Normal thyroid.  LUNGS: Clear to auscultation bilaterally.  HEART: Regular rate and rhythm. BREASTS: Symmetric in size. No palpable masses or lymphadenopathy, skin changes, or nipple drainage. ABDOMEN: Soft, nontender, nondistended. No organomegaly. PELVIC: Normal external female genitalia. Vagina is pink and rugated.  Normal discharge. Normal appearing cervix. Uterus is normal in size. No adnexal mass or tenderness. Chaperone present during the pelvic exam EXTREMITIES: No cyanosis, clubbing, or edema, 2+ distal pulses.     Assessment:    Healthy female exam.      Plan:    Pap smear collected Screening mammogram reported normal 06/2023 Patient had colonoscopy  a year ago- per report plan was for repeat colonoscopy in 6 months but patient missed- new referral placed Patient will be contacted with abnormal results Patient scheduled to see PCP tomorrow with plans for fasting exam See After Visit Summary for Counseling Recommendations

## 2023-08-03 NOTE — Progress Notes (Signed)
 53 y.o. New GYN presents for AEX/PAP.  Last Mammogram June 2025.

## 2023-08-07 LAB — CYTOLOGY - PAP
Comment: NEGATIVE
Diagnosis: NEGATIVE
High risk HPV: NEGATIVE

## 2023-08-10 ENCOUNTER — Ambulatory Visit (AMBULATORY_SURGERY_CENTER): Admitting: *Deleted

## 2023-08-10 VITALS — Ht 63.0 in | Wt 157.0 lb

## 2023-08-10 DIAGNOSIS — Z860100 Personal history of colon polyps, unspecified: Secondary | ICD-10-CM

## 2023-08-10 MED ORDER — NA SULFATE-K SULFATE-MG SULF 17.5-3.13-1.6 GM/177ML PO SOLN: 1.0000 | Freq: Once | ORAL | 0 refills | Status: AC

## 2023-08-10 NOTE — Progress Notes (Signed)
 Pt's name and DOB verified at the beginning of the pre-visit wit 2 identifiers  Permission given to speak with son   Pt denies any difficulty with ambulating,sitting, laying down or rolling side to side  Pt has no issues moving head neck or swallowing  No egg or soy allergy known to patient   No issues known to pt with past sedation with any surgeries or procedures  Patient denies ever being intubated  No FH of Malignant Hyperthermia  Pt is not on home 02   Pt is not on blood thinners   Pt has frequent issues with constipation RN instructed pt to use Miralax per bottles instructions a week before prep days. Pt states they will  Pt is not on dialysis  Pt denise any abnormal heart rhythms   Pt denies any upcoming cardiac testing  Patient's chart reviewed by Norleen Schillings CNRA prior to pre-visit and patient appropriate for the [facility].  Pre-visit completed and red dot placed by patient's name on their procedure day (on provider's schedule).    Visit in person with interpretor for vietnamese  Pt states weight is 157 lb   IInstructions reviewed. English and [place] copy given to ptPt given  both [facility] main # and MD on call # prior to instructions.  Pt states understanding of instructions. Instructed pt to review instructions again prior to procedure and call main # given if has questions.. Pt states they will.   Instructed pt on where to find instructions on My Chart.

## 2023-08-24 ENCOUNTER — Ambulatory Visit: Admitting: Internal Medicine

## 2023-08-24 ENCOUNTER — Encounter: Payer: Self-pay | Admitting: Internal Medicine

## 2023-08-24 VITALS — BP 114/75 | HR 104 | Temp 97.4°F | Resp 22 | Ht 63.0 in | Wt 157.0 lb

## 2023-08-24 DIAGNOSIS — D128 Benign neoplasm of rectum: Secondary | ICD-10-CM | POA: Diagnosis not present

## 2023-08-24 DIAGNOSIS — Z1211 Encounter for screening for malignant neoplasm of colon: Secondary | ICD-10-CM

## 2023-08-24 DIAGNOSIS — Z860100 Personal history of colon polyps, unspecified: Secondary | ICD-10-CM

## 2023-08-24 DIAGNOSIS — D129 Benign neoplasm of anus and anal canal: Secondary | ICD-10-CM

## 2023-08-24 DIAGNOSIS — D124 Benign neoplasm of descending colon: Secondary | ICD-10-CM

## 2023-08-24 DIAGNOSIS — K5730 Diverticulosis of large intestine without perforation or abscess without bleeding: Secondary | ICD-10-CM | POA: Diagnosis not present

## 2023-08-24 DIAGNOSIS — D123 Benign neoplasm of transverse colon: Secondary | ICD-10-CM

## 2023-08-24 DIAGNOSIS — Z860101 Personal history of adenomatous and serrated colon polyps: Secondary | ICD-10-CM

## 2023-08-24 DIAGNOSIS — K6389 Other specified diseases of intestine: Secondary | ICD-10-CM | POA: Diagnosis not present

## 2023-08-24 DIAGNOSIS — D122 Benign neoplasm of ascending colon: Secondary | ICD-10-CM

## 2023-08-24 MED ORDER — SODIUM CHLORIDE 0.9 % IV SOLN: 500.0000 mL | Freq: Once | INTRAVENOUS | Status: AC

## 2023-08-24 NOTE — Op Note (Signed)
 [facility] Patient Name: Renee Mccann Procedure Date: 08/24/2023 9:21 AM MRN: [id number] Endoscopist: Norleen SAILOR. Abran , MD, [id number] Age: 53 Referring MD:  Date of Birth: 10/04/1970 Gender: Female Account #: [id number] Procedure:                Colonoscopy with cold snare polypectomy x 3; biopsy                            polypectomy x 1 Indications:              High risk colon cancer surveillance: Personal                            history of adenoma (10 mm or greater in size), High                            risk colon cancer surveillance: Personal history of                            multiple (3 or more) adenomas. Index examination                            May 2004 with multiple advanced adenomatous colon                            polyps including right-sided lateral spreading                            tumor removed via EMR with postresection and                            adjacent tattoo. Now for surveillance Medicines:                Monitored Anesthesia Care Procedure:                Pre-Anesthesia Assessment:                           - Prior to the procedure, a History and Physical                            was performed, and patient medications and                            allergies were reviewed. The patient's tolerance of                            previous anesthesia was also reviewed. The risks                            and benefits of the procedure and the sedation                            options and risks were discussed with the patient.  All questions were answered, and informed consent                            was obtained. Prior Anticoagulants: The patient has                            taken no anticoagulant or antiplatelet agents. ASA                            Grade Assessment: II - A patient with mild systemic                            disease. After reviewing the risks and benefits,                             the patient was deemed in satisfactory condition to                            undergo the procedure.                           After obtaining informed consent, the colonoscope                            was passed under direct vision. Throughout the                            procedure, the patient's blood pressure, pulse, and                            oxygen saturations were monitored continuously. The                            Olympus Scope [id number] was introduced through the                            anus and advanced to the the cecum, identified by                            appendiceal orifice and ileocecal valve. The                            ileocecal valve, appendiceal orifice, and rectum                            were photographed. The quality of the bowel                            preparation was excellent. The colonoscopy was                            performed without difficulty. The patient tolerated  the procedure well. The bowel preparation used was                            SUPREP via split dose instruction. Scope In: 9:34:49 AM Scope Out: 9:49:49 AM Scope Withdrawal Time: 0 hours 13 minutes 3 seconds  Total Procedure Duration: 0 hours 15 minutes 0 seconds  Findings:                 Three polyps were found in the descending colon,                            transverse colon and ascending colon. The polyps                            were 3 to 5 mm in size. These polyps were removed                            with a cold snare. Resection and retrieval were                            complete.                           A 1 mm polyp was found in the rectum. The polyp was                            removed with a jumbo cold forceps. Resection and                            retrieval were complete.                           Multiple small-mouthed diverticula were found in                            the left colon. The area of previous  resection was                            easily identified by combination of tattoo and                            scar. No residual adenoma.                           There was diffuse mild melanosis coli. The exam was                            otherwise without abnormality on direct and                            retroflexion views. Complications:            No immediate complications. Estimated blood loss:  None. Estimated Blood Loss:     Estimated blood loss: none. Impression:               - Three 3 to 5 mm polyps in the descending colon,                            in the transverse colon and in the ascending colon,                            removed with a cold snare. Resected and retrieved.                           - One 1 mm polyp in the rectum, removed with a                            jumbo cold forceps. Resected and retrieved.                           - Diverticulosis in the left colon. Previous                            right-sided resection residual adenoma. Melanosis                            coli                           - The examination was otherwise normal on direct                            and retroflexion views. Recommendation:           - Repeat colonoscopy in 3 years for surveillance.                           - Patient has a contact number available for                            emergencies. The signs and symptoms of potential                            delayed complications were discussed with the                            patient. Return to normal activities tomorrow.                            Written discharge instructions were provided to the                            patient.                           - Resume previous diet.                           -  Continue present medications.                           - Await pathology results. Norleen SAILOR. Abran, MD 08/24/2023 9:58:09 AM This report has been signed electronically.

## 2023-08-24 NOTE — Progress Notes (Signed)
 Daughter, Wenceslao Martinis, interpreting for client.

## 2023-08-24 NOTE — Progress Notes (Signed)
 Called to room to assist during endoscopic procedure.  Patient ID and intended procedure confirmed with present staff. Received instructions for my participation in the procedure from the performing physician.

## 2023-08-24 NOTE — Patient Instructions (Signed)
   Handouts on polyps & diverticulosis given to you today.   Await pathology results on polyps removed   Continue previous diet & medications    YOU HAD AN ENDOSCOPIC PROCEDURE TODAY AT THE [facility]:   Refer to the procedure report that was given to you for any specific questions about what was found during the examination.  If the procedure report does not answer your questions, please call your gastroenterologist to clarify.  If you requested that your care partner not be given the details of your procedure findings, then the procedure report has been included in a sealed envelope for you to review at your convenience later.  YOU SHOULD EXPECT: Some feelings of bloating in the abdomen. Passage of more gas than usual.  Walking can help get rid of the air that was put into your GI tract during the procedure and reduce the bloating. If you had a lower endoscopy (such as a colonoscopy or flexible sigmoidoscopy) you may notice spotting of blood in your stool or on the toilet paper. If you underwent a bowel prep for your procedure, you may not have a normal bowel movement for a few days.  Please Note:  You might notice some irritation and congestion in your nose or some drainage.  This is from the oxygen used during your procedure.  There is no need for concern and it should clear up in a day or so.  SYMPTOMS TO REPORT IMMEDIATELY:  Following lower endoscopy (colonoscopy or flexible sigmoidoscopy):  Excessive amounts of blood in the stool  Significant tenderness or worsening of abdominal pains  Swelling of the abdomen that is new, acute  Fever of 100F or higher   For urgent or emergent issues, a gastroenterologist can be reached at any hour by calling [phone]. Do not use MyChart messaging for urgent concerns.    DIET:  We do recommend a small meal at first, but then you may proceed to your regular diet.  Drink plenty of fluids but you should avoid alcoholic  beverages for 24 hours.  ACTIVITY:  You should plan to take it easy for the rest of today and you should NOT DRIVE or use heavy machinery until tomorrow (because of the sedation medicines used during the test).    FOLLOW UP: Our staff will call the number listed on your records the next business day following your procedure.  We will call around 7:15- 8:00 am to check on you and address any questions or concerns that you may have regarding the information given to you following your procedure. If we do not reach you, we will leave a message.     If any biopsies were taken you will be contacted by phone or by letter within the next 1-3 weeks.  Please call us at [phone] if you have not heard about the biopsies in 3 weeks.    SIGNATURES/CONFIDENTIALITY: You and/or your care partner have signed paperwork which will be entered into your electronic medical record.  These signatures attest to the fact that that the information above on your After Visit Summary has been reviewed and is understood.  Full responsibility of the confidentiality of this discharge information lies with you and/or your care-partner.

## 2023-08-24 NOTE — Progress Notes (Signed)
 HISTORY OF PRESENT ILLNESS:  Renee Mccann is a 53 y.o. 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  Now for surveillance colonoscopy.  REVIEW OF SYSTEMS:  All non-GI ROS negative except for  Past Medical History:  Diagnosis Date   Anxiety    Cataract    Chronic back pain    Diabetes mellitus without complication (HCC)    Type II   Gestational diabetes    Hyperlipidemia    Hypertension    Hypokalemia    Kidney stones    Pregnancy induced hypertension    Pyelonephritis     Past Surgical History:  Procedure Laterality Date   APPENDECTOMY     COLONOSCOPY  06/13/2022   Norleen Kiang at [facility], polyps   HERNIA REPAIR     VAGINAL DELIVERY      Social History Renee Mccann  reports that she quit smoking about 4 years ago. Her smoking use included cigarettes. She has never used smokeless tobacco. She reports that she does not drink alcohol and does not use drugs.  family history is not on file.  Allergies  Allergen Reactions   Other Itching    Patient is allergic to some kind of cream  and cant remember what it is.It was for pain.       PHYSICAL EXAMINATION: Vital signs: BP 114/79   Pulse 91   Temp (!) 97.4 F (36.3 C)   Ht 5' 3 (1.6 m)   Wt 157 lb (71.2 kg)   SpO2 97%   BMI 27.81 kg/m  General: Well-developed, well-nourished, no acute distress HEENT: Sclerae are anicteric, conjunctiva pink. Oral mucosa intact Lungs: Clear Heart: Regular Abdomen: soft, nontender, nondistended, no obvious ascites, no peritoneal signs, normal bowel sounds. No organomegaly. Extremities: No edema Psychiatric: alert and oriented x3. Cooperative     ASSESSMENT:  Multiple advanced adenomatous polyps   PLAN:  Surveillance colonoscopy

## 2023-08-24 NOTE — Progress Notes (Signed)
 Sedate, gd SR, tolerated procedure well, VSS, report to RN

## 2023-08-24 NOTE — Progress Notes (Signed)
 Pt's states no medical or surgical changes since previsit or office visit.

## 2023-08-25 ENCOUNTER — Telehealth: Payer: Self-pay | Admitting: *Deleted

## 2023-08-25 NOTE — Telephone Encounter (Signed)
  Follow up Call-     08/24/2023    8:42 AM 06/13/2022   10:51 AM  Call back number  Post procedure Call Back phone  # [phone]  Permission to leave phone message Yes Yes     Patient questions:  Do you have a fever, pain , or abdominal swelling? No. Pain Score  0 *  Have you tolerated food without any problems? Yes.    Have you been able to return to your normal activities? Yes.    Do you have any questions about your discharge instructions: Diet   No. Medications  No. Follow up visit  No.  Do you have questions or concerns about your Care? No.  Actions: * If pain score is 4 or above: No action needed, pain <4.

## 2023-08-26 ENCOUNTER — Ambulatory Visit: Payer: Self-pay | Admitting: Internal Medicine

## 2023-08-26 LAB — SURGICAL PATHOLOGY

## 2023-11-12 ENCOUNTER — Encounter: Payer: Self-pay | Admitting: *Deleted

## 2023-11-12 ENCOUNTER — Telehealth: Payer: Self-pay

## 2023-11-12 DIAGNOSIS — I10 Essential (primary) hypertension: Secondary | ICD-10-CM

## 2023-11-12 NOTE — Patient Outreach (Signed)
 Complex Care Management   Visit Note  11/12/2023  Name:  Renee Mccann MRN: [id number] DOB: Apr 10, 1970  Situation: RNCM presented with a referral directly from a provider to assist a patient with medication procurement and adherence along with Diabetes management. Provider reported last A1C was 12.4 in May with CBG checks on [place]. RNCM will contact [facility] office and verified pt is eligibility and request a referral for [facility] and pharmacy to intervened.  Spoke with CMA for this request and RNCM currently awaiting the official referral for appointment date for the telephonic assessment.    Olam Ku, RN, BSN [place]  [facility], [facility] RN Care Manager Direct Dial: [phone]  Fax: [phone]

## 2023-11-16 ENCOUNTER — Telehealth: Payer: Self-pay | Admitting: *Deleted

## 2023-11-16 NOTE — Progress Notes (Unsigned)
 Care Guide Pharmacy Note  11/16/2023 Name: Naila Elizondo MRN: [id number] DOB: 06-25-1970  Referred By: [name], PA Reason for referral: Call Attempt #1 and Complex Care Management (Outreach to schedule referral with pharmacist )   Ladora Soledad Aidyn Sportsman is a 53 y.o. year old female who is a primary care patient of [name], [place].  Ladora Soledad Lacy Lendell was referred to the pharmacist for assistance related to: HTN  An unsuccessful telephone outreach was attempted today to contact the patient who was referred to the pharmacy team for assistance with medication assistance. Additional attempts will be made to contact the patient.  Thedford Franks, CMA Anna  [facility], [facility] Guide Direct Dial: [phone]  Fax: [phone] Website: [email or web address]

## 2023-11-17 ENCOUNTER — Telehealth: Payer: Self-pay

## 2023-11-17 NOTE — Progress Notes (Signed)
   11/17/2023  Patient ID: Renee Mccann, female   DOB: 01-23-1970, 53 y.o.   MRN: [id number]  Contacted patient with interpreter services regarding referral for diabetes, medication access, and medication management from [name], [place] .   Left patient a voicemail to return my call at their convenience  Heather Factor, PharmD Clinical Pharmacist  [phone]

## 2023-11-18 NOTE — Progress Notes (Unsigned)
 Complex Care Management Note Care Guide Note  11/18/2023 Name: Renee Mccann MRN: [id number] DOB: [birth year]   Complex Care Management Outreach Attempts: A second unsuccessful outreach was attempted today to offer the patient with information about available complex care management services.  Follow Up Plan:  Additional outreach attempts will be made to offer the patient complex care management information and services.   Encounter Outcome:  No Answer  Thedford Franks, CMA [place]  [facility], [facility] Guide Direct Dial: [phone]  Fax: [phone] Website: [email or web address]

## 2023-11-19 NOTE — Progress Notes (Signed)
 Complex Care Management Note Care Guide Note  11/19/2023 Name: Renee Mccann MRN: [id number] DOB: 10/02/1970   Complex Care Management Outreach Attempts: A third unsuccessful outreach was attempted today to offer the patient with information about available complex care management services.  Follow Up Plan:  No further outreach attempts will be made at this time. We have been unable to contact the patient to offer or enroll patient in complex care management services.  Encounter Outcome:  No Answer  Thedford Franks, CMA   [facility], [facility] Guide Direct Dial: [phone]  Fax: [phone] Website: [email or web address]

## 2023-11-23 NOTE — Progress Notes (Signed)
   11/23/2023  Patient ID: Renee Mccann, female   DOB: 10-30-1970, 53 y.o.   MRN: [id number]  Contacted patient with interpreter services regarding referral for diabetes, medication access, and medication management from [name], [place] .   Left patient a voicemail to return my call at their convenience  Heather Factor, PharmD Clinical Pharmacist  [phone]

## 2023-11-24 ENCOUNTER — Telehealth: Payer: Self-pay

## 2023-11-24 NOTE — Progress Notes (Signed)
   11/24/2023  Patient ID: Renee Mccann, female   DOB: 25-Jul-1970, 53 y.o.   MRN: [id number]  Contacted patient with interpreter services regarding referral for diabetes, medication access, and medication management from [name], [place] .   Left patient a voicemail to return my call at their convenience.   A third unsuccesful telephone outreach was attempted today to contact the patient who was referred to the pharmacy for assistance with medication management. The Population Health team is pleased to engage with this patient at any time in the future upon receipt of referral and should he/she be interested in assistance from the [facility] team.   Heather Factor, PharmD Clinical Pharmacist  [phone]

## 2023-12-01 ENCOUNTER — Telehealth: Payer: Self-pay

## 2023-12-01 NOTE — Progress Notes (Signed)
   12/01/2023  Patient ID: Renee Mccann, female   DOB: [birth year], 53 y.o.   MRN: [id number]  PCP requested assistance with insulin  and CGM coverage for patient. I was unable to reach her for the original pharmacy referral for diabetes management. I was unsuccessful outreaching her today, with interpreter services, but left a voicemail for the patient to return my call at her earliest convenience.   I confirmed with [facility] that the patient picked up insulin  degludec vials on today 12/01/23 for a $0 copay. I verified that Part B information required and will follow up for any additional coverage issues. A new rx for Dexcom G7 sensors, changing every 10 days, and 1 receiver, was sent to [facility].   Heather Factor, PharmD Clinical Pharmacist  [phone]
